# Patient Record
Sex: Male | Born: 1945 | Race: White | Hispanic: No | Smoking: Current every day smoker
Health system: Southern US, Community
[De-identification: ages and names within clinical notes are randomized; demographics above are authoritative.]

## PROBLEM LIST (undated history)

## (undated) DIAGNOSIS — F419 Anxiety disorder, unspecified: Secondary | ICD-10-CM

## (undated) DIAGNOSIS — I2510 Atherosclerotic heart disease of native coronary artery without angina pectoris: Secondary | ICD-10-CM

## (undated) DIAGNOSIS — J45909 Unspecified asthma, uncomplicated: Secondary | ICD-10-CM

## (undated) DIAGNOSIS — N289 Disorder of kidney and ureter, unspecified: Secondary | ICD-10-CM

## (undated) DIAGNOSIS — C3490 Malignant neoplasm of unspecified part of unspecified bronchus or lung: Secondary | ICD-10-CM

## (undated) DIAGNOSIS — I509 Heart failure, unspecified: Secondary | ICD-10-CM

## (undated) DIAGNOSIS — I10 Essential (primary) hypertension: Secondary | ICD-10-CM

## (undated) DIAGNOSIS — R918 Other nonspecific abnormal finding of lung field: Secondary | ICD-10-CM

## (undated) DIAGNOSIS — F329 Major depressive disorder, single episode, unspecified: Secondary | ICD-10-CM

## (undated) DIAGNOSIS — E119 Type 2 diabetes mellitus without complications: Secondary | ICD-10-CM

## (undated) DIAGNOSIS — F32A Depression, unspecified: Secondary | ICD-10-CM

## (undated) DIAGNOSIS — Z0000 Encounter for general adult medical examination without abnormal findings: Secondary | ICD-10-CM

## (undated) DIAGNOSIS — M359 Systemic involvement of connective tissue, unspecified: Secondary | ICD-10-CM

## (undated) DIAGNOSIS — J60 Coalworker's pneumoconiosis: Secondary | ICD-10-CM

## (undated) DIAGNOSIS — J449 Chronic obstructive pulmonary disease, unspecified: Secondary | ICD-10-CM

## (undated) HISTORY — PX: KIDNEY STONE SURGERY: SHX686

## (undated) HISTORY — PX: BACK SURGERY: SHX140

## (undated) HISTORY — PX: CHOLECYSTECTOMY: SHX55

## (undated) HISTORY — PX: ANKLE RECONSTRUCTION: SHX1151

## (undated) HISTORY — DX: Other nonspecific abnormal finding of lung field: R91.8

## (undated) HISTORY — DX: Malignant neoplasm of unspecified part of unspecified bronchus or lung: C34.90

## (undated) HISTORY — PX: IVC FILTER PLACEMENT (ARMC HX): HXRAD1551

---

## 2011-02-11 DIAGNOSIS — F1120 Opioid dependence, uncomplicated: Secondary | ICD-10-CM | POA: Insufficient documentation

## 2011-02-20 DIAGNOSIS — F1411 Cocaine abuse, in remission: Secondary | ICD-10-CM | POA: Insufficient documentation

## 2011-02-20 DIAGNOSIS — E785 Hyperlipidemia, unspecified: Secondary | ICD-10-CM | POA: Insufficient documentation

## 2012-06-09 DIAGNOSIS — M961 Postlaminectomy syndrome, not elsewhere classified: Secondary | ICD-10-CM | POA: Insufficient documentation

## 2014-07-24 DIAGNOSIS — R079 Chest pain, unspecified: Secondary | ICD-10-CM | POA: Insufficient documentation

## 2015-07-09 DIAGNOSIS — F329 Major depressive disorder, single episode, unspecified: Secondary | ICD-10-CM | POA: Insufficient documentation

## 2015-07-09 DIAGNOSIS — F419 Anxiety disorder, unspecified: Secondary | ICD-10-CM | POA: Insufficient documentation

## 2015-07-09 DIAGNOSIS — F32A Depression, unspecified: Secondary | ICD-10-CM | POA: Insufficient documentation

## 2015-09-03 ENCOUNTER — Emergency Department
Admission: EM | Admit: 2015-09-03 | Discharge: 2015-09-03 | Disposition: A | Discharge status: Home / Self Care | Payer: Medicare Other | Attending: Emergency Medicine | Admitting: Emergency Medicine

## 2015-09-03 ENCOUNTER — Encounter: Payer: Self-pay | Admitting: Emergency Medicine

## 2015-09-03 ENCOUNTER — Emergency Department: Payer: Medicare Other

## 2015-09-03 DIAGNOSIS — I509 Heart failure, unspecified: Secondary | ICD-10-CM | POA: Insufficient documentation

## 2015-09-03 DIAGNOSIS — R079 Chest pain, unspecified: Secondary | ICD-10-CM | POA: Insufficient documentation

## 2015-09-03 DIAGNOSIS — I10 Essential (primary) hypertension: Secondary | ICD-10-CM | POA: Diagnosis not present

## 2015-09-03 DIAGNOSIS — R1011 Right upper quadrant pain: Secondary | ICD-10-CM | POA: Diagnosis not present

## 2015-09-03 DIAGNOSIS — E119 Type 2 diabetes mellitus without complications: Secondary | ICD-10-CM | POA: Diagnosis not present

## 2015-09-03 HISTORY — DX: Type 2 diabetes mellitus without complications: E11.9

## 2015-09-03 HISTORY — DX: Systemic involvement of connective tissue, unspecified: M35.9

## 2015-09-03 HISTORY — DX: Depression, unspecified: F32.A

## 2015-09-03 HISTORY — DX: Essential (primary) hypertension: I10

## 2015-09-03 HISTORY — DX: Major depressive disorder, single episode, unspecified: F32.9

## 2015-09-03 HISTORY — DX: Unspecified asthma, uncomplicated: J45.909

## 2015-09-03 HISTORY — DX: Heart failure, unspecified: I50.9

## 2015-09-03 HISTORY — DX: Encounter for general adult medical examination without abnormal findings: Z00.00

## 2015-09-03 HISTORY — DX: Chronic obstructive pulmonary disease, unspecified: J44.9

## 2015-09-03 HISTORY — DX: Atherosclerotic heart disease of native coronary artery without angina pectoris: I25.10

## 2015-09-03 LAB — COMPREHENSIVE METABOLIC PANEL
ALBUMIN: 4.3 g/dL (ref 3.5–5.0)
ALT: 28 U/L (ref 17–63)
AST: 38 U/L (ref 15–41)
Alkaline Phosphatase: 113 U/L (ref 38–126)
Anion gap: 11 (ref 5–15)
BUN: 12 mg/dL (ref 6–20)
CHLORIDE: 98 mmol/L — AB (ref 101–111)
CO2: 22 mmol/L (ref 22–32)
CREATININE: 1.08 mg/dL (ref 0.61–1.24)
Calcium: 9 mg/dL (ref 8.9–10.3)
GFR calc Af Amer: >60 mL/min (ref >60)
GFR calc non Af Amer: >60 mL/min (ref >60)
Glucose, Bld: 269 mg/dL — ABNORMAL HIGH (ref 65–99)
POTASSIUM: 4.2 mmol/L (ref 3.5–5.1)
SODIUM: 131 mmol/L — AB (ref 135–145)
Total Bilirubin: 0.9 mg/dL (ref 0.3–1.2)
Total Protein: 8 g/dL (ref 6.5–8.1)

## 2015-09-03 LAB — CBC
HCT: 44.2 % (ref 40.0–52.0)
Hemoglobin: 14.3 g/dL (ref 13.0–18.0)
MCH: 25.8 pg — ABNORMAL LOW (ref 26.0–34.0)
MCHC: 32.3 g/dL (ref 32.0–36.0)
MCV: 80 fL (ref 80.0–100.0)
PLATELETS: 182 10*3/uL (ref 150–440)
RBC: 5.53 MIL/uL (ref 4.40–5.90)
RDW: 14 % (ref 11.5–14.5)
WBC: 13.9 10*3/uL — AB (ref 3.8–10.6)

## 2015-09-03 LAB — FIBRIN DERIVATIVES D-DIMER (ARMC ONLY): Fibrin derivatives D-dimer (ARMC): 650.88 — ABNORMAL HIGH (ref 0–499)

## 2015-09-03 LAB — PROTIME-INR
INR: 1.76
Prothrombin Time: 20.7 seconds — ABNORMAL HIGH (ref 11.4–15.0)

## 2015-09-03 LAB — LIPASE, BLOOD: LIPASE: 17 U/L — AB (ref 22–51)

## 2015-09-03 LAB — TROPONIN I

## 2015-09-03 LAB — APTT: APTT: 24 s (ref 24–36)

## 2015-09-03 MED ORDER — HYDROMORPHONE HCL 1 MG/ML IJ SOLN
1.0000 mg | Freq: Once | INTRAMUSCULAR | Status: AC
  Administered 2015-09-03: 1 mg via INTRAVENOUS
  Filled 2015-09-03: qty 1

## 2015-09-03 MED ORDER — OXYCODONE-ACETAMINOPHEN 5-325 MG PO TABS: 1.0000 | ORAL_TABLET | Freq: Four times a day (QID) | ORAL | Status: DC | PRN

## 2015-09-03 MED ORDER — ONDANSETRON 8 MG PO TBDP: 8.0000 mg | ORAL_TABLET | Freq: Three times a day (TID) | ORAL | Status: DC | PRN

## 2015-09-03 MED ORDER — METOCLOPRAMIDE HCL 10 MG PO TABS: 10.0000 mg | ORAL_TABLET | Freq: Three times a day (TID) | ORAL | Status: DC

## 2015-09-03 MED ORDER — DIPHENHYDRAMINE HCL 25 MG PO CAPS: 50.0000 mg | ORAL_CAPSULE | Freq: Four times a day (QID) | ORAL | Status: DC | PRN

## 2015-09-03 MED ORDER — IOHEXOL 240 MG/ML SOLN
25.0000 mL | Freq: Once | INTRAMUSCULAR | Status: AC | PRN
  Administered 2015-09-03: 25 mL via ORAL

## 2015-09-03 MED ORDER — ONDANSETRON HCL 4 MG/2ML IJ SOLN
4.0000 mg | Freq: Once | INTRAMUSCULAR | Status: AC
  Administered 2015-09-03: 4 mg via INTRAVENOUS
  Filled 2015-09-03: qty 2

## 2015-09-03 MED ORDER — FAMOTIDINE 20 MG PO TABS
40.0000 mg | ORAL_TABLET | Freq: Once | ORAL | Status: AC
  Administered 2015-09-03: 40 mg via ORAL
  Filled 2015-09-03: qty 2

## 2015-09-03 MED ORDER — GI COCKTAIL ~~LOC~~
30.0000 mL | ORAL | Status: AC
Start: 2015-09-03 — End: 2015-09-03
  Administered 2015-09-03: 30 mL via ORAL
  Filled 2015-09-03 (×2): qty 30

## 2015-09-03 MED ORDER — IOHEXOL 350 MG/ML SOLN
100.0000 mL | Freq: Once | INTRAVENOUS | Status: AC | PRN
  Administered 2015-09-03: 100 mL via INTRAVENOUS

## 2015-09-03 MED ORDER — HYDROMORPHONE HCL 1 MG/ML IJ SOLN
INTRAMUSCULAR | Status: AC
  Filled 2015-09-03: qty 1

## 2015-09-03 MED ORDER — SODIUM CHLORIDE 0.9 % IV BOLUS (SEPSIS)
1000.0000 mL | Freq: Once | INTRAVENOUS | Status: AC
  Administered 2015-09-03: 1000 mL via INTRAVENOUS

## 2015-09-03 NOTE — ED Notes (Signed)
Pt here with right sided flank pain that started last night and increaed today. Pt states that he is having nausea, but no vomiting.  Pt is holding right side due to pain. Pt in wheelchair and appears to be in severe pain.

## 2015-09-03 NOTE — ED Notes (Addendum)
meds given.  Resumed care from tricia rn.  Pt alert.  Iv in place.

## 2015-09-03 NOTE — ED Provider Notes (Signed)
[facility] Emergency Department Provider Note  ____________________________________________  Time seen: 11:35 AM  I have reviewed the triage vital signs and the nursing notes.   HISTORY  Chief Complaint Abdominal Pain    HPI Fernando Marshall is a 69 y.o. male who complains of right-sided flank pain that started last night, sudden onset while at rest, constant and worsening. It is severe in intensity in the right upper quadrant radiating through to the back. He reports nausea but no vomiting. Unable to eat or drink anything today. No trauma or falls. Reports it is worse with deep breathing and is associated with a feeling of shortness of breath and chest pain in the right inferior chest as well. No fevers or chills. No diarrhea no syncope.  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 He also has an IVC filter.     Past Medical History  Diagnosis Date  . Hypertension   . CHF (congestive heart failure)   . Asthma   . COPD (chronic obstructive pulmonary disease)   . Coronary artery disease   . Diabetes mellitus without complication   . Depression   . PE (physical exam), annual   . Collagen vascular disease      There are no active problems to display for this patient.    Past Surgical History  Procedure Laterality Date  . Ivc filter placement ([facility] hx)       No current outpatient prescriptions on file. Coumadin  Allergies Metformin and related and Morphine and related   No family history on file.  Social History Social History  Substance Use Topics  . Smoking status: Never Smoker   . Smokeless tobacco: Not on file  . Alcohol Use: No    Review of Systems  Constitutional:   No fever or chills. No weight changes Eyes:   No blurry vision or double vision.  ENT:   No sore throat. Cardiovascular:   No chest pain. Respiratory:   No dyspnea or cough. Gastrointestinal:   Positive right upper quadrant abdominal  pain as above. No vomiting or diarrhea..  No BRBPR or melena. Genitourinary:   Negative for dysuria, urinary retention, bloody urine, or difficulty urinating. Musculoskeletal:   Negative for back pain. No joint swelling or pain. Skin:   Negative for rash. Neurological:   Negative for headaches, focal weakness or numbness. Psychiatric:  No anxiety or depression.   Endocrine:  No hot/cold intolerance, changes in energy, or sleep difficulty.  10-point ROS otherwise negative.  ____________________________________________   PHYSICAL EXAM:  VITAL SIGNS: ED Triage Vitals  Enc Vitals Group     BP 09/03/15 1056 170/74 mmHg     Pulse Rate 09/03/15 1056 79     Resp 09/03/15 1056 18     Temp 09/03/15 1056 98 F (36.7 C)     Temp Source 09/03/15 1056 Oral     SpO2 09/03/15 1056 97 %     Weight 09/03/15 1056 203 lb (92.08 kg)     Height 09/03/15 1056 '5\' 6"'$  (1.676 m)     Head Cir --      Peak Flow --      Pain Score 09/03/15 1134 10     Pain Loc --      Pain Edu? --      Excl. in [place]? --      Constitutional:   Alert and oriented. Moderate distress due to pain. Eyes:   No scleral icterus.  No conjunctival pallor. PERRL. EOMI ENT   Head:   Normocephalic and atraumatic.   Nose:   No congestion/rhinnorhea. No septal hematoma   Mouth/Throat:   MMM, no pharyngeal erythema. No peritonsillar mass. No uvula shift.   Neck:   No stridor. No SubQ emphysema. No meningismus. Hematological/Lymphatic/Immunilogical:   No cervical lymphadenopathy. Cardiovascular:   RRR. Normal and symmetric distal pulses are present in all extremities. No murmurs, rubs, or gallops. Respiratory:   Normal respiratory effort without tachypnea nor retractions. Breath sounds are clear and equal bilaterally. No wheezes/rales/rhonchi. Gastrointestinal:   Right upper quadrant and epigastric tenderness.. No distention. There is right CVA tenderness.  No rebound, rigidity, or guarding. Genitourinary:    deferred Musculoskeletal:   Nontender with normal range of motion in all extremities. No joint effusions.  No lower extremity tenderness.  No edema. Neurologic:   Normal speech and language.  CN 2-10 normal. Motor grossly intact. No pronator drift.  Normal gait. No gross focal neurologic deficits are appreciated.  Skin:    Skin is warm, dry and intact. No rash noted.  No petechiae, purpura, or bullae. Psychiatric:   Mood and affect are normal. Speech and behavior are normal. Patient exhibits appropriate insight and judgment.  ____________________________________________    LABS (pertinent positives/negatives) (all labs ordered are listed, but only abnormal results are displayed) Labs Reviewed  CBC - Abnormal; Notable for the following:    WBC 13.9 (*)    MCH 25.8 (*)    All other components within normal limits  COMPREHENSIVE METABOLIC PANEL - Abnormal; Notable for the following:    Sodium 131 (*)    Chloride 98 (*)    Glucose, Bld 269 (*)    All other components within normal limits  PROTIME-INR - Abnormal; Notable for the following:    Prothrombin Time 20.7 (*)    All other components within normal limits  FIBRIN DERIVATIVES D-DIMER ([facility] ONLY) - Abnormal; Notable for the following:    Fibrin derivatives D-dimer ([facility]) 650.88 (*)    All other components within normal limits  LIPASE, BLOOD - Abnormal; Notable for the following:    Lipase 17 (*)    All other components within normal limits  APTT  TROPONIN I   ____________________________________________   EKG  Interpreted by me Normal sinus rhythm rate of 81, left axis, normal intervals, voltage criteria for left ventricular hypertrophy, normal ST segments and T waves.  ____________________________________________    RADIOLOGY  Ultrasound right upper quadrant finds that the gallbladder is surgically absent. Chest x-ray reveals a nonspecific right infrahilar density and airway thickening suggestive of bronchitis or  reactive airway disease.  CT angiography of the chest and CT abdomen and pelvis with IV contrast unremarkable.  ____________________________________________   PROCEDURES   ____________________________________________   INITIAL IMPRESSION / ASSESSMENT AND PLAN / ED COURSE  Pertinent labs & imaging results that were available during my care of the patient were reviewed by me and considered in my medical decision making (see chart for details).  Patient presents with severe pain concerning for cholecystitis. We'll get ultrasound of the right upper quadrant as well as chest x-ray and labs done. IV saline and Zofran and Dilaudid for symptom control.  ----------------------------------------- 2:40 PM on 09/03/2015 -----------------------------------------  Labs are unremarkable. Ultrasound found that the patient actually has had a prior cholecystectomy which she did not recall when asked about his surgical history. Given this and his other history of PE and a subtherapeutic INR, a CT angiogram of the chest  was obtained along with CT imaging of the abdomen and pelvis. These have resulted in her overall unremarkable. No isolation for his pain, but it does not appear to be anything significant at this time. We'll plan to discharge him home. He takes Dilaudid at home. The patient's d-dimer is mildly elevated, which is expected given his chronic IVC filter.     ____________________________________________   FINAL CLINICAL IMPRESSION(S) / ED DIAGNOSES  Final diagnoses:  Chest pain   right upper quadrant abdominal pain.    Carrie Mew, MD 09/03/15 [phone]

## 2015-09-03 NOTE — Discharge Instructions (Signed)
Your chest x-ray is unremarkable. Your ultrasound shows that you had previously had your gallbladder removed. CT scans of your chest and abdomen were unremarkable. Your blood tests were all unremarkable. At this point there is no explanation for your pain but it does not appear to be anything serious. Please follow-up with your doctor this week for continued monitoring of your symptoms.  Abdominal Pain Many things can cause abdominal pain. Usually, abdominal pain is not caused by a disease and will improve without treatment. It can often be observed and treated at home. Your health care provider will do a physical exam and possibly order blood tests and X-rays to help determine the seriousness of your pain. However, in many cases, more time must pass before a clear cause of the pain can be found. Before that point, your health care provider may not know if you need more testing or further treatment. HOME CARE INSTRUCTIONS  Monitor your abdominal pain for any changes. The following actions may help to alleviate any discomfort you are experiencing:  Only take over-the-counter or prescription medicines as directed by your health care provider.  Do not take laxatives unless directed to do so by your health care provider.  Try a clear liquid diet (broth, tea, or water) as directed by your health care provider. Slowly move to a bland diet as tolerated. SEEK MEDICAL CARE IF:  You have unexplained abdominal pain.  You have abdominal pain associated with nausea or diarrhea.  You have pain when you urinate or have a bowel movement.  You experience abdominal pain that wakes you in the night.  You have abdominal pain that is worsened or improved by eating food.  You have abdominal pain that is worsened with eating fatty foods.  You have a fever. SEEK IMMEDIATE MEDICAL CARE IF:   Your pain does not go away within 2 hours.  You keep throwing up (vomiting).  Your pain is felt only in portions of  the abdomen, such as the right side or the left lower portion of the abdomen.  You pass bloody or black tarry stools. MAKE SURE YOU:  Understand these instructions.   Will watch your condition.   Will get help right away if you are not doing well or get worse.  Document Released: 09/17/2005 Document Revised: 12/13/2013 Document Reviewed: 08/17/2013 [facility] Patient Information 2015 [place], [place]. This information is not intended to replace advice given to you by your health care provider. Make sure you discuss any questions you have with your health care provider.

## 2015-09-04 ENCOUNTER — Encounter: Payer: Self-pay | Admitting: *Deleted

## 2015-09-04 ENCOUNTER — Other Ambulatory Visit: Payer: Self-pay

## 2015-09-04 ENCOUNTER — Emergency Department
Admission: EM | Admit: 2015-09-04 | Discharge: 2015-09-04 | Disposition: A | Discharge status: Home / Self Care | Payer: Medicare Other | Attending: Emergency Medicine | Admitting: Emergency Medicine

## 2015-09-04 DIAGNOSIS — E119 Type 2 diabetes mellitus without complications: Secondary | ICD-10-CM | POA: Diagnosis not present

## 2015-09-04 DIAGNOSIS — M546 Pain in thoracic spine: Secondary | ICD-10-CM | POA: Insufficient documentation

## 2015-09-04 DIAGNOSIS — M549 Dorsalgia, unspecified: Secondary | ICD-10-CM

## 2015-09-04 DIAGNOSIS — I10 Essential (primary) hypertension: Secondary | ICD-10-CM | POA: Insufficient documentation

## 2015-09-04 DIAGNOSIS — R079 Chest pain, unspecified: Secondary | ICD-10-CM | POA: Insufficient documentation

## 2015-09-04 DIAGNOSIS — Z79899 Other long term (current) drug therapy: Secondary | ICD-10-CM | POA: Diagnosis not present

## 2015-09-04 DIAGNOSIS — M792 Neuralgia and neuritis, unspecified: Secondary | ICD-10-CM

## 2015-09-04 LAB — CBC
HEMATOCRIT: 43.2 % (ref 40.0–52.0)
Hemoglobin: 14.1 g/dL (ref 13.0–18.0)
MCH: 26.2 pg (ref 26.0–34.0)
MCHC: 32.6 g/dL (ref 32.0–36.0)
MCV: 80.4 fL (ref 80.0–100.0)
PLATELETS: 155 10*3/uL (ref 150–440)
RBC: 5.37 MIL/uL (ref 4.40–5.90)
RDW: 14.2 % (ref 11.5–14.5)
WBC: 10.5 10*3/uL (ref 3.8–10.6)

## 2015-09-04 LAB — COMPREHENSIVE METABOLIC PANEL
ALT: 68 U/L — ABNORMAL HIGH (ref 17–63)
ANION GAP: 11 (ref 5–15)
AST: 47 U/L — AB (ref 15–41)
Albumin: 4.1 g/dL (ref 3.5–5.0)
Alkaline Phosphatase: 170 U/L — ABNORMAL HIGH (ref 38–126)
BILIRUBIN TOTAL: 0.5 mg/dL (ref 0.3–1.2)
BUN: 16 mg/dL (ref 6–20)
CHLORIDE: 104 mmol/L (ref 101–111)
CO2: 22 mmol/L (ref 22–32)
Calcium: 9 mg/dL (ref 8.9–10.3)
Creatinine, Ser: 1.08 mg/dL (ref 0.61–1.24)
Glucose, Bld: 185 mg/dL — ABNORMAL HIGH (ref 65–99)
POTASSIUM: 3.8 mmol/L (ref 3.5–5.1)
Sodium: 137 mmol/L (ref 135–145)
TOTAL PROTEIN: 7.7 g/dL (ref 6.5–8.1)

## 2015-09-04 LAB — TROPONIN I: Troponin I: 0.03 ng/mL (ref <0.031)

## 2015-09-04 MED ORDER — ACYCLOVIR 200 MG PO CAPS
400.0000 mg | ORAL_CAPSULE | Freq: Once | ORAL | Status: AC
  Administered 2015-09-04: 400 mg via ORAL
  Filled 2015-09-04: qty 2

## 2015-09-04 MED ORDER — DIAZEPAM 2 MG PO TABS
2.0000 mg | ORAL_TABLET | Freq: Once | ORAL | Status: AC
  Administered 2015-09-04: 2 mg via ORAL

## 2015-09-04 MED ORDER — OXYCODONE HCL 5 MG PO TABS
ORAL_TABLET | ORAL | Status: AC
  Administered 2015-09-04: 5 mg via ORAL
  Filled 2015-09-04: qty 1

## 2015-09-04 MED ORDER — KETOROLAC TROMETHAMINE 60 MG/2ML IM SOLN
60.0000 mg | Freq: Once | INTRAMUSCULAR | Status: AC
  Administered 2015-09-04: 60 mg via INTRAMUSCULAR

## 2015-09-04 MED ORDER — DIAZEPAM 2 MG PO TABS
ORAL_TABLET | ORAL | Status: AC
  Administered 2015-09-04: 2 mg via ORAL
  Filled 2015-09-04: qty 1

## 2015-09-04 MED ORDER — DIAZEPAM 2 MG PO TABS: 2.0000 mg | ORAL_TABLET | Freq: Three times a day (TID) | ORAL | Status: DC | PRN

## 2015-09-04 MED ORDER — OXYCODONE HCL 5 MG PO TABS
5.0000 mg | ORAL_TABLET | Freq: Once | ORAL | Status: AC
  Administered 2015-09-04: 5 mg via ORAL

## 2015-09-04 MED ORDER — ACYCLOVIR 400 MG PO TABS: 400.0000 mg | ORAL_TABLET | Freq: Every day | ORAL | Status: AC

## 2015-09-04 MED ORDER — OXYCODONE HCL 5 MG PO TABS
ORAL_TABLET | ORAL | Status: AC
  Administered 2015-09-04: 10 mg via ORAL
  Filled 2015-09-04: qty 2

## 2015-09-04 MED ORDER — KETOROLAC TROMETHAMINE 60 MG/2ML IM SOLN
INTRAMUSCULAR | Status: AC
  Administered 2015-09-04: 60 mg via INTRAMUSCULAR
  Filled 2015-09-04: qty 2

## 2015-09-04 MED ORDER — OXYCODONE HCL 5 MG PO TABS
10.0000 mg | ORAL_TABLET | Freq: Once | ORAL | Status: AC
  Administered 2015-09-04: 10 mg via ORAL

## 2015-09-04 NOTE — ED Provider Notes (Signed)
[facility] Emergency Department Provider Note  ____________________________________________  Time seen: 1827  I have reviewed the triage vital signs and the nursing notes.   HISTORY  Chief Complaint Severe pain, right middle back, thoracic    HPI Fernando Marshall is a 69 y.o. male who developed severe pain in his right thoracic area yesterday. He was seen in the emergency department and had a thorough evaluation, including chest x-ray and CT scan to rule out pulmonary emboli. He does have a history of prior blood clots area he has an IVC filter in place.  His CT scan yesterday was negative for pulmonary emboli. His chest x-ray was negative as well as. I have reviewed the history and physical from that encounter.  The patient was treated with Dilaudid. He reports this got the pain under control. The medicine wore off last night and he has had pain since then. He has not been able to get the medicine that was prescribed filled. He lives in a group home, and they do that slower fashion.  The pain is worse with any movement. He has difficulty sitting up or standing up.  He denies any rash. He has no history of shingles, although the location makes me, the physician, consider that as a possible diagnosis.    Past Medical History  Diagnosis Date  . Hypertension   . CHF (congestive heart failure)   . Asthma   . COPD (chronic obstructive pulmonary disease)   . Coronary artery disease   . Diabetes mellitus without complication   . Depression   . PE (physical exam), annual   . Collagen vascular disease     There are no active problems to display for this patient.   Past Surgical History  Procedure Laterality Date  . Ivc filter placement ([facility] hx)      Current Outpatient Rx  Name  Route  Sig  Dispense  Refill  . acyclovir (ZOVIRAX) 400 MG tablet   Oral   Take 1 tablet (400 mg total) by mouth 5 (five) times daily.   50 tablet   0   . diazepam (VALIUM) 2  MG tablet   Oral   Take 1 tablet (2 mg total) by mouth every 8 (eight) hours as needed for anxiety or muscle spasms.   20 tablet   0   . diphenhydrAMINE (BENADRYL) 25 mg capsule   Oral   Take 2 capsules (50 mg total) by mouth every 6 (six) hours as needed.   60 capsule   0   . metoCLOPramide (REGLAN) 10 MG tablet   Oral   Take 1 tablet (10 mg total) by mouth 4 (four) times daily -  before meals and at bedtime.   60 tablet   0   . ondansetron (ZOFRAN ODT) 8 MG disintegrating tablet   Oral   Take 1 tablet (8 mg total) by mouth every 8 (eight) hours as needed for nausea or vomiting.   20 tablet   0   . oxyCODONE-acetaminophen (ROXICET) 5-325 MG per tablet   Oral   Take 1 tablet by mouth every 6 (six) hours as needed for severe pain.   12 tablet   0     Allergies Metformin and related and Morphine and related  No family history on file.  Social History Social History  Substance Use Topics  . Smoking status: Never Smoker   . Smokeless tobacco: None  . Alcohol Use: No    Review of Systems  Constitutional: Negative for fever. ENT: Negative for sore throat. Cardiovascular: Chest pain in the right posterior. See history of present illness. Respiratory: Hurts to breath. See history of present illness Gastrointestinal: Negative for abdominal pain, vomiting and diarrhea. Genitourinary: Negative for dysuria. Musculoskeletal: No myalgias or injuries. Skin: Negative for rash. Neurological: Negative for headaches   10-point ROS otherwise negative.  ____________________________________________   PHYSICAL EXAM:  VITAL SIGNS: ED Triage Vitals  Enc Vitals Group     BP 09/04/15 1523 177/79 mmHg     Pulse Rate 09/04/15 1523 88     Resp 09/04/15 1523 18     Temp 09/04/15 1523 98 F (36.7 C)     Temp Source 09/04/15 1523 Oral     SpO2 09/04/15 1523 94 %     Weight 09/04/15 1523 203 lb (92.08 kg)     Height 09/04/15 1523 '5\' 6"'$  (1.676 m)     Head Cir --      Peak  Flow --      Pain Score 09/04/15 1534 10     Pain Loc --      Pain Edu? --      Excl. in [place]? --     Constitutional: Alert and oriented. Appears a little uncomfortable, but worse when he attempts to sit up or moves. ENT   Head: Normocephalic and atraumatic.   Nose: No congestion/rhinnorhea.   Mouth/Throat: Mucous membranes are moist. Cardiovascular: Normal rate, regular rhythm, no murmur noted Thoracic: Patient was notable tenderness beginning in the right side of his back at around T8 or 9. Moving laterally, there is an exquisitely tender focal area, very tender on light palpation. Respiratory:  Normal respiratory effort, no tachypnea.    Breath sounds are clear and equal bilaterally.  Gastrointestinal: Soft and nontender. No distention. . Musculoskeletal: No deformity noted. Nontender with normal range of motion in all extremities.  No noted edema. Neurologic:  Normal speech and language. No gross focal neurologic deficits are appreciated.  Skin:  Skin is warm, dry. No rash noted. No break in skin or indication of a zoster in the tender area in his right posterior thoracic area. Psychiatric: Mood and affect are normal. Speech and behavior are normal.  ____________________________________________    LABS (pertinent positives/negatives)  Labs Reviewed  COMPREHENSIVE METABOLIC PANEL - Abnormal; Notable for the following:    Glucose, Bld 185 (*)    AST 47 (*)    ALT 68 (*)    Alkaline Phosphatase 170 (*)    All other components within normal limits  CBC  TROPONIN I     ____________________________________________   EKG  ED ECG REPORT I, Meta Kroenke W, the attending physician, personally viewed and interpreted this ECG.   Date: 09/04/2015  EKG Time: 1602  Rate: 78  Rhythm: Normal sinus rhythm  Axis: Left axis deviation  Intervals: Normal  ST&T Change: None  noted   ____________________________________________ ____________________________________________   INITIAL IMPRESSION / ASSESSMENT AND PLAN / ED COURSE  Pertinent labs & imaging results that were available during my care of the patient were reviewed by me and considered in my medical decision making (see chart for details).  I reviewed the evaluation, H&P, and test results from yesterday. He had a negative CT ruling out PE. This also rules out a number of other items that could be on the differential diagnosis. #1 on the differential is a superficial tenderness. He is exquisitely tender to very light palpation. This has made pondering shingles without skin reaction yet.  I am also considering a musculoskeletal inflammatory process. We will treat him with Toradol IM, oxycodone, and a little Valium to help with muscle tension in case this is primarily musculoskeletal.  ----------------------------------------- 8:47 PM on 09/04/2015 -----------------------------------------  Patient is little bit more comfortable after the medications. He sits up a little bit more easily. Repeat exam of his lungs finds them still clear without wheezing.  I discussed the differential diagnosis of the patient. This includes the possibility of neurogenic pain, including shingles. While there is no skin outbreak yet, the severity of his pain raises the question of shingles and we will place him on acyclovir. He feels the Valium did help him and we will prescribe a low-dose, 2 mg tablet to be used when necessary.  ____________________________________________   FINAL CLINICAL IMPRESSION(S) / ED DIAGNOSES  Final diagnoses:  Upper back pain on right side  Neurogenic pain      Ahmed Prima, MD 09/04/15 2113

## 2015-09-04 NOTE — ED Notes (Signed)
Pt reports "lung pain" over the mid right back area since Sunday. Pt was here for same symptoms last night, states "pain is worse." Has not filled meds prescribed yesterday.

## 2015-09-04 NOTE — ED Notes (Signed)
Called and spoke with Fernando Marshall from pt's group home [place] [phone]); informed caregiver pt ready for d/c; caregiver yelling, stating "what are ya'll thinking sending him home this time of night, I got other residents here!"; caregiver then hangs up phone; called caregiver back and again informed her that her resident is ready to be picked up and again caregiver yelling, stating "ya'll better put him back in a taxi or something"; informed caregiver that it is her responsibility to provide transporation for her resident; caregiver again hangs up phone; attempted to call back and call goes directly to voicemail; notified charge nurse who will send [facility] PD officer to home

## 2015-09-04 NOTE — ED Notes (Signed)
Patient states Sunday night he was watching a ball game and developed pain to right lung area to right back. Pain worsens with movement.

## 2015-09-04 NOTE — ED Notes (Signed)
Spero Geralds, caregiver here to transport pt home

## 2015-09-04 NOTE — Discharge Instructions (Signed)
You have a very focal and exquisite tenderness in your right upper back. This may be musculoskeletal or it may be neurogenic. This would include the possible diagnosis of shingles, but you have no rash or skin break. Take medicines prescribed by the doctor he saw yesterday. He may also add acyclovir in case this is shingles. Take Valium to reduce muscle spasm in case this is more musculoskeletal. Follow-up with another doctor for reevaluation and ongoing care. Return to the emergency department if you have uncontrolled pain, increasing shortness of breath, or other urgent concerns.  Neuropathic Pain We often think that pain has a physical cause. If we get rid of the cause, the pain should go away. Nerves themselves can also cause pain. It is called neuropathic pain, which means nerve abnormality. It may be difficult for the patients who have it and for the treating caregivers. Pain is usually described as acute (short-lived) or chronic (long-lasting). Acute pain is related to the physical sensations caused by an injury. It can last from a few seconds to many weeks, but it usually goes away when normal healing occurs. Chronic pain lasts beyond the typical healing time. With neuropathic pain, the nerve fibers themselves may be damaged or injured. They then send incorrect signals to other pain centers. The pain you feel is real, but the cause is not easy to find.  CAUSES  Chronic pain can result from diseases, such as diabetes and shingles (an infection related to chickenpox), or from trauma, surgery, or amputation. It can also happen without any known injury or disease. The nerves are sending pain messages, even though there is no identifiable cause for such messages.   Other common causes of neuropathy include diabetes, phantom limb pain, or Regional Pain Syndrome (RPS).  As with all forms of chronic back pain, if neuropathy is not correctly treated, there can be a number of associated problems that lead  to a downward cycle for the patient. These include depression, sleeplessness, feelings of fear and anxiety, limited social interaction and inability to do normal daily activities or work.  The most dramatic and mysterious example of neuropathic pain is called "phantom limb syndrome." This occurs when an arm or a leg has been removed because of illness or injury. The brain still gets pain messages from the nerves that originally carried impulses from the missing limb. These nerves now seem to misfire and cause troubling pain.  Neuropathic pain often seems to have no cause. It responds poorly to standard pain treatment. Neuropathic pain can occur after:  Shingles (herpes zoster virus infection).  A lasting burning sensation of the skin, caused usually by injury to a peripheral nerve.  Peripheral neuropathy which is widespread nerve damage, often caused by diabetes or alcoholism.  Phantom limb pain following an amputation.  Facial nerve problems (trigeminal neuralgia).  Multiple sclerosis.  Reflex sympathetic dystrophy.  Pain which comes with cancer and cancer chemotherapy.  Entrapment neuropathy such as when pressure is put on a nerve such as in carpal tunnel syndrome.  Back, leg, and hip problems (sciatica).  Spine or back surgery.  HIV Infection or AIDS where nerves are infected by viruses. Your caregiver can explain items in the above list which may apply to you. SYMPTOMS  Characteristics of neuropathic pain are:  Severe, sharp, electric shock-like, shooting, lightening-like, knife-like.  Pins and needles sensation.  Deep burning, deep cold, or deep ache.  Persistent numbness, tingling, or weakness.  Pain resulting from light touch or other stimulus that would  not usually cause pain.  Increased sensitivity to something that would normally cause pain, such as a pinprick. Pain may persist for months or years following the healing of damaged tissues. When this happens, pain  signals no longer sound an alarm about current injuries or injuries about to happen. Instead, the alarm system itself is not working correctly.  Neuropathic pain may get worse instead of better over time. For some people, it can lead to serious disability. It is important to be aware that severe injury in a limb can occur without a proper, protective pain response.Burns, cuts, and other injuries may go unnoticed. Without proper treatment, these injuries can become infected or lead to further disability. Take any injury seriously, and consult your caregiver for treatment. DIAGNOSIS  When you have a pain with no known cause, your caregiver will probably ask some specific questions:   Do you have any other conditions, such as diabetes, shingles, multiple sclerosis, or HIV infection?  How would you describe your pain? (Neuropathic pain is often described as shooting, stabbing, burning, or searing.)  Is your pain worse at any time of the day? (Neuropathic pain is usually worse at night.)  Does the pain seem to follow a certain physical pathway?  Does the pain come from an area that has missing or injured nerves? (An example would be phantom limb pain.)  Is the pain triggered by minor things such as rubbing against the sheets at night? These questions often help define the type of pain involved. Once your caregiver knows what is happening, treatment can begin. Anticonvulsant, antidepressant drugs, and various pain relievers seem to work in some cases. If another condition, such as diabetes is involved, better management of that disorder may relieve the neuropathic pain.  TREATMENT  Neuropathic pain is frequently long-lasting and tends not to respond to treatment with narcotic type pain medication. It may respond well to other drugs such as antiseizure and antidepressant medications. Usually, neuropathic problems do not completely go away, but partial improvement is often possible with proper treatment.  Your caregivers have large numbers of medications available to treat you. Do not be discouraged if you do not get immediate relief. Sometimes different medications or a combination of medications will be tried before you receive the results you are hoping for. See your caregiver if you have pain that seems to be coming from nowhere and does not go away. Help is available.  SEEK IMMEDIATE MEDICAL CARE IF:   There is a sudden change in the quality of your pain, especially if the change is on only one side of the body.  You notice changes of the skin, such as redness, black or purple discoloration, swelling, or an ulcer.  You cannot move the affected limbs. Document Released: 09/04/2004 Document Revised: 03/01/2012 Document Reviewed: 09/04/2004 [facility] Patient Information 2015 [place], [place]. This information is not intended to replace advice given to you by your health care provider. Make sure you discuss any questions you have with your health care provider.

## 2015-09-24 ENCOUNTER — Encounter: Payer: Self-pay | Admitting: Emergency Medicine

## 2015-09-24 ENCOUNTER — Emergency Department
Admission: EM | Admit: 2015-09-24 | Discharge: 2015-09-24 | Disposition: A | Discharge status: Home / Self Care | Payer: Medicare Other | Attending: Emergency Medicine | Admitting: Emergency Medicine

## 2015-09-24 DIAGNOSIS — E119 Type 2 diabetes mellitus without complications: Secondary | ICD-10-CM | POA: Insufficient documentation

## 2015-09-24 DIAGNOSIS — H6092 Unspecified otitis externa, left ear: Secondary | ICD-10-CM | POA: Insufficient documentation

## 2015-09-24 DIAGNOSIS — I10 Essential (primary) hypertension: Secondary | ICD-10-CM | POA: Diagnosis not present

## 2015-09-24 DIAGNOSIS — Z79899 Other long term (current) drug therapy: Secondary | ICD-10-CM | POA: Diagnosis not present

## 2015-09-24 DIAGNOSIS — H9202 Otalgia, left ear: Secondary | ICD-10-CM | POA: Diagnosis present

## 2015-09-24 MED ORDER — HYDROCODONE-ACETAMINOPHEN 5-325 MG PO TABS: 1.0000 | ORAL_TABLET | Freq: Four times a day (QID) | ORAL | Status: DC | PRN

## 2015-09-24 MED ORDER — OXYCODONE-ACETAMINOPHEN 5-325 MG PO TABS
1.0000 | ORAL_TABLET | Freq: Once | ORAL | Status: AC
  Administered 2015-09-24: 1 via ORAL
  Filled 2015-09-24: qty 1

## 2015-09-24 MED ORDER — CIPROFLOXACIN HCL 500 MG PO TABS: 500.0000 mg | ORAL_TABLET | Freq: Two times a day (BID) | ORAL | Status: DC

## 2015-09-24 MED ORDER — CIPROFLOXACIN-DEXAMETHASONE 0.3-0.1 % OT SUSP: 4.0000 [drp] | Freq: Two times a day (BID) | OTIC | Status: DC

## 2015-09-24 MED ORDER — TRAMADOL HCL 50 MG PO TABS: 100.0000 mg | ORAL_TABLET | Freq: Once | ORAL | Status: DC

## 2015-09-24 MED ORDER — CIPROFLOXACIN-DEXAMETHASONE 0.3-0.1 % OT SUSP
4.0000 [drp] | Freq: Two times a day (BID) | OTIC | Status: DC
  Administered 2015-09-24: 4 [drp] via OTIC
  Filled 2015-09-24: qty 7.5

## 2015-09-24 MED ORDER — CIPROFLOXACIN HCL 500 MG PO TABS
500.0000 mg | ORAL_TABLET | Freq: Once | ORAL | Status: AC
  Administered 2015-09-24: 500 mg via ORAL
  Filled 2015-09-24: qty 1

## 2015-09-24 NOTE — ED Provider Notes (Signed)
[facility] Emergency Department Provider Note ____________________________________________  Time seen: 6301  I have reviewed the triage vital signs and the nursing notes.  HISTORY  Chief Complaint  Otalgia  HPI Fernando Marshall is a 69 y.o. male reports to the ED for evaluation of left ear pain since Thursday. He is rested his daughter attempted to flush the ear with peroxide, thinking he had an impaction of wax. Since that time was noted increased redness, swelling, and pain to the left ear, and face. He denies any interim fevers, chills, sweats. Increased swelling in the ear has worsened his already poor hearing on the left side.  Past Medical History  Diagnosis Date  . Hypertension   . CHF (congestive heart failure) ([place])   . Asthma   . COPD (chronic obstructive pulmonary disease) ([place])   . Coronary artery disease   . Diabetes mellitus without complication ([place])   . Depression   . PE (physical exam), annual   . Collagen vascular disease ([place])     There are no active problems to display for this patient.   Past Surgical History  Procedure Laterality Date  . Ivc filter placement ([facility] hx)      Current Outpatient Rx  Name  Route  Sig  Dispense  Refill  . ciprofloxacin (CIPRO) 500 MG tablet   Oral   Take 1 tablet (500 mg total) by mouth 2 (two) times daily.   20 tablet   0   . ciprofloxacin-dexamethasone (CIPRODEX) otic suspension   Left Ear   Place 4 drops into the left ear 2 (two) times daily.   7.5 mL   0   . diazepam (VALIUM) 2 MG tablet   Oral   Take 1 tablet (2 mg total) by mouth every 8 (eight) hours as needed for anxiety or muscle spasms.   20 tablet   0   . diphenhydrAMINE (BENADRYL) 25 mg capsule   Oral   Take 2 capsules (50 mg total) by mouth every 6 (six) hours as needed.   60 capsule   0   . HYDROcodone-acetaminophen (NORCO) 5-325 MG tablet   Oral   Take 1 tablet by mouth every 6 (six) hours as needed for moderate pain.  12 tablet   0   . metoCLOPramide (REGLAN) 10 MG tablet   Oral   Take 1 tablet (10 mg total) by mouth 4 (four) times daily -  before meals and at bedtime.   60 tablet   0   . ondansetron (ZOFRAN ODT) 8 MG disintegrating tablet   Oral   Take 1 tablet (8 mg total) by mouth every 8 (eight) hours as needed for nausea or vomiting.   20 tablet   0   . oxyCODONE-acetaminophen (ROXICET) 5-325 MG per tablet   Oral   Take 1 tablet by mouth every 6 (six) hours as needed for severe pain.   12 tablet   0     Allergies Metformin and related and Morphine and related  History reviewed. No pertinent family history.  Social History Social History  Substance Use Topics  . Smoking status: Never Smoker   . Smokeless tobacco: None  . Alcohol Use: No   Review of Systems  Constitutional: Negative for fever. Eyes: Negative for visual changes. ENT: Negative for sore throat. Left ear pain and swelling as above. Cardiovascular: Negative for chest pain. Respiratory: Negative for shortness of breath. Gastrointestinal: Negative for abdominal pain, vomiting and diarrhea. Genitourinary: Negative for dysuria. Musculoskeletal:  Negative for back pain. Skin: Negative for rash. Neurological: Negative for headaches, focal weakness or numbness. ____________________________________________  PHYSICAL EXAM:  VITAL SIGNS: ED Triage Vitals  Enc Vitals Group     BP 09/24/15 1531 179/135 mmHg     Pulse Rate 09/24/15 1531 113     Resp 09/24/15 1531 18     Temp 09/24/15 1531 97.8 F (36.6 C)     Temp Source 09/24/15 1531 Oral     SpO2 09/24/15 1531 97 %     Weight --      Height --      Head Cir --      Peak Flow --      Pain Score 09/24/15 1524 8     Pain Loc --      Pain Edu? --      Excl. in [place]? --    Constitutional: Alert and oriented. Well appearing and in no distress. Eyes: Conjunctivae are normal. PERRL. Normal extraocular movements. Ears: Left ear with obvious swelling and erythema to  the pinna. Patient with significant swelling to the ear canal and evaluation. There is also some appreciable purulent and ceruminous drainage from the canal. Patient is also noted to have some increased erythema to the left side of the face. Posterior auricular lymphadenopathy is appreciated on the left.   Head: Normocephalic and atraumatic.   Nose: No congestion/rhinorrhea.   Mouth/Throat: Mucous membranes are moist.   Neck: Supple. No thyromegaly. Hematological/Lymphatic/Immunological: No cervical lymphadenopathy. Cardiovascular: Normal rate, regular rhythm.  Respiratory: Normal respiratory effort. No wheezes/rales/rhonchi. Gastrointestinal: Soft and nontender. No distention. Musculoskeletal: Nontender with normal range of motion in all extremities.  Neurologic:  Normal gait without ataxia. Normal speech and language. No gross focal neurologic deficits are appreciated. Skin:  Skin is warm, dry and intact. No rash noted. Psychiatric: Mood and affect are normal. Patient exhibits appropriate insight and judgment. ____________________________________________  PROCEDURES  Ear wick instilled in left ear. CiproDex 4 gtts left ear Cipro 500 mg po ____________________________________________  INITIAL IMPRESSION / ASSESSMENT AND PLAN / ED COURSE  Acute otitis externa on the left ear with local erythema and cellulitis to the external ear. Patient will be discharged with Cipro twice a day, and Ciprodex drops. He is provided with instruction for use of eardrops with the ear wick. He is to follow with Dr. Kathyrn Sheriff as needed for ongoing symptoms. ____________________________________________  FINAL CLINICAL IMPRESSION(S) / ED DIAGNOSES  Final diagnoses:  Otitis externa, left      Melvenia Needles, PA-C 09/24/15 1603  Daymon Larsen, MD 09/24/15 2146

## 2015-09-24 NOTE — Discharge Instructions (Signed)
Ear Drops You need to put eardrops in your ear. HOME CARE   Put drops in your affected ear as told.  After putting in the drops, lie down with the ear you put the drops in facing up. Stay this way for 10 minutes. Use the ear drops as long as your doctor tells you.  Before you get up, put a cotton ball gently in your ear. Do not push it far in your ear.  Do not wash out your ears unless your doctor says it is okay.  Finish all medicines as told by your doctor. You may be told to keep using the eardrops even if you start to feel better.  See your doctor as told for follow-up visits. GET HELP IF:  You have pain that gets worse.  Any unusual fluid (drainage) is coming from your ear (especially if the fluid stinks).  You have trouble hearing.  You get really dizzy as if the room is spinning and feel sick to your stomach (vertigo).  The outside of your ear becomes red or puffy or both. This may be a sign of an allergic reaction. MAKE SURE YOU:   Understand these instructions.  Will watch your condition.  Will get help right away if you are not doing well or get worse. Document Released: 05/28/2010 Document Revised: 12/13/2013 Document Reviewed: 07/05/2013 [facility] Patient Information 2015 [place], [place]. This information is not intended to replace advice given to you by your health care provider. Make sure you discuss any questions you have with your health care provider.  Otitis Externa Otitis externa is a bacterial or fungal infection of the outer ear canal. This is the area from the eardrum to the outside of the ear. Otitis externa is sometimes called "swimmer's ear." CAUSES  Possible causes of infection include:  Swimming in dirty water.  Moisture remaining in the ear after swimming or bathing.  Mild injury (trauma) to the ear.  Objects stuck in the ear (foreign body).  Cuts or scrapes (abrasions) on the outside of the ear. SIGNS AND SYMPTOMS  The first symptom of  infection is often itching in the ear canal. Later signs and symptoms may include swelling and redness of the ear canal, ear pain, and yellowish-white fluid (pus) coming from the ear. The ear pain may be worse when pulling on the earlobe. DIAGNOSIS  Your health care provider will perform a physical exam. A sample of fluid may be taken from the ear and examined for bacteria or fungi. TREATMENT  Antibiotic ear drops are often given for 10 to 14 days. Treatment may also include pain medicine or corticosteroids to reduce itching and swelling. HOME CARE INSTRUCTIONS   Apply antibiotic ear drops to the ear canal as prescribed by your health care provider.  Take medicines only as directed by your health care provider.  If you have diabetes, follow any additional treatment instructions from your health care provider.  Keep all follow-up visits as directed by your health care provider. PREVENTION   Keep your ear dry. Use the corner of a towel to absorb water out of the ear canal after swimming or bathing.  Avoid scratching or putting objects inside your ear. This can damage the ear canal or remove the protective wax that lines the canal. This makes it easier for bacteria and fungi to grow.  Avoid swimming in lakes, polluted water, or poorly chlorinated pools.  You may use ear drops made of rubbing alcohol and vinegar after swimming. Combine equal parts  of white vinegar and alcohol in a bottle. Put 3 or 4 drops into each ear after swimming. SEEK MEDICAL CARE IF:   You have a fever.  Your ear is still red, swollen, painful, or draining pus after 3 days.  Your redness, swelling, or pain gets worse.  You have a severe headache.  You have redness, swelling, pain, or tenderness in the area behind your ear. MAKE SURE YOU:   Understand these instructions.  Will watch your condition.  Will get help right away if you are not doing well or get worse. Document Released: 12/08/2005 Document  Revised: 04/24/2014 Document Reviewed: 12/25/2011 [facility] Patient Information 2015 [place], [place]. This information is not intended to replace advice given to you by your health care provider. Make sure you discuss any questions you have with your health care provider.  Use the ear drops 2 times daily for the next 7 days. Take the antibiotic as directed until completely gone. Take the pain medicine as needed. Follow-up with Dr. Kathyrn Sheriff for ongoing symptoms and recheck. Call his office to schedule an appointment.

## 2015-10-31 ENCOUNTER — Emergency Department: Payer: Medicare Other

## 2015-10-31 ENCOUNTER — Emergency Department
Admission: EM | Admit: 2015-10-31 | Discharge: 2015-10-31 | Disposition: A | Discharge status: Home / Self Care | Payer: Medicare Other | Attending: Emergency Medicine | Admitting: Emergency Medicine

## 2015-10-31 ENCOUNTER — Encounter: Payer: Self-pay | Admitting: Intensive Care

## 2015-10-31 DIAGNOSIS — E1165 Type 2 diabetes mellitus with hyperglycemia: Secondary | ICD-10-CM | POA: Diagnosis not present

## 2015-10-31 DIAGNOSIS — R739 Hyperglycemia, unspecified: Secondary | ICD-10-CM

## 2015-10-31 DIAGNOSIS — R1110 Vomiting, unspecified: Secondary | ICD-10-CM | POA: Diagnosis not present

## 2015-10-31 DIAGNOSIS — K920 Hematemesis: Secondary | ICD-10-CM | POA: Diagnosis present

## 2015-10-31 DIAGNOSIS — G8929 Other chronic pain: Secondary | ICD-10-CM | POA: Diagnosis not present

## 2015-10-31 DIAGNOSIS — E860 Dehydration: Secondary | ICD-10-CM | POA: Diagnosis not present

## 2015-10-31 DIAGNOSIS — R911 Solitary pulmonary nodule: Secondary | ICD-10-CM | POA: Diagnosis not present

## 2015-10-31 DIAGNOSIS — M549 Dorsalgia, unspecified: Secondary | ICD-10-CM | POA: Insufficient documentation

## 2015-10-31 DIAGNOSIS — Z79899 Other long term (current) drug therapy: Secondary | ICD-10-CM | POA: Insufficient documentation

## 2015-10-31 DIAGNOSIS — J159 Unspecified bacterial pneumonia: Secondary | ICD-10-CM | POA: Insufficient documentation

## 2015-10-31 DIAGNOSIS — I10 Essential (primary) hypertension: Secondary | ICD-10-CM | POA: Diagnosis not present

## 2015-10-31 DIAGNOSIS — Z720 Tobacco use: Secondary | ICD-10-CM | POA: Insufficient documentation

## 2015-10-31 DIAGNOSIS — J189 Pneumonia, unspecified organism: Secondary | ICD-10-CM

## 2015-10-31 HISTORY — DX: Anxiety disorder, unspecified: F41.9

## 2015-10-31 HISTORY — DX: Coalworker's pneumoconiosis: J60

## 2015-10-31 LAB — URINALYSIS COMPLETE WITH MICROSCOPIC (ARMC ONLY)
BACTERIA UA: NONE SEEN
Bilirubin Urine: NEGATIVE
Glucose, UA: >500 mg/dL — AB
Hgb urine dipstick: NEGATIVE
Ketones, ur: NEGATIVE mg/dL
Leukocytes, UA: NEGATIVE
Nitrite: NEGATIVE
PROTEIN: 100 mg/dL — AB
SPECIFIC GRAVITY, URINE: 1.02 (ref 1.005–1.030)
SQUAMOUS EPITHELIAL / LPF: NONE SEEN
pH: 7 (ref 5.0–8.0)

## 2015-10-31 LAB — COMPREHENSIVE METABOLIC PANEL
ALT: 21 U/L (ref 17–63)
AST: 23 U/L (ref 15–41)
Albumin: 4.5 g/dL (ref 3.5–5.0)
Alkaline Phosphatase: 140 U/L — ABNORMAL HIGH (ref 38–126)
Anion gap: 11 (ref 5–15)
BUN: 16 mg/dL (ref 6–20)
CHLORIDE: 97 mmol/L — AB (ref 101–111)
CO2: 25 mmol/L (ref 22–32)
CREATININE: 1.2 mg/dL (ref 0.61–1.24)
Calcium: 9.4 mg/dL (ref 8.9–10.3)
GFR calc Af Amer: >60 mL/min (ref >60)
GFR calc non Af Amer: >60 mL/min (ref >60)
Glucose, Bld: 419 mg/dL — ABNORMAL HIGH (ref 65–99)
Potassium: 3.7 mmol/L (ref 3.5–5.1)
SODIUM: 133 mmol/L — AB (ref 135–145)
Total Bilirubin: 0.7 mg/dL (ref 0.3–1.2)
Total Protein: 8.3 g/dL — ABNORMAL HIGH (ref 6.5–8.1)

## 2015-10-31 LAB — ETHANOL: Alcohol, Ethyl (B): <5 mg/dL (ref <5)

## 2015-10-31 LAB — CBC WITH DIFFERENTIAL/PLATELET
BASOS ABS: 0.1 10*3/uL (ref 0–0.1)
BASOS PCT: 1 %
EOS ABS: 0.2 10*3/uL (ref 0–0.7)
EOS PCT: 2 %
HCT: 47.3 % (ref 40.0–52.0)
Hemoglobin: 15.3 g/dL (ref 13.0–18.0)
Lymphocytes Relative: 19 %
Lymphs Abs: 2.2 10*3/uL (ref 1.0–3.6)
MCH: 26 pg (ref 26.0–34.0)
MCHC: 32.4 g/dL (ref 32.0–36.0)
MCV: 80.3 fL (ref 80.0–100.0)
Monocytes Absolute: 0.5 10*3/uL (ref 0.2–1.0)
Monocytes Relative: 4 %
Neutro Abs: 8.8 10*3/uL — ABNORMAL HIGH (ref 1.4–6.5)
Neutrophils Relative %: 74 %
PLATELETS: 180 10*3/uL (ref 150–440)
RBC: 5.89 MIL/uL (ref 4.40–5.90)
RDW: 16.1 % — ABNORMAL HIGH (ref 11.5–14.5)
WBC: 11.7 10*3/uL — ABNORMAL HIGH (ref 3.8–10.6)

## 2015-10-31 LAB — TROPONIN I

## 2015-10-31 LAB — GLUCOSE, CAPILLARY: GLUCOSE-CAPILLARY: 292 mg/dL — AB (ref 65–99)

## 2015-10-31 LAB — LIPASE, BLOOD: Lipase: 34 U/L (ref 11–51)

## 2015-10-31 MED ORDER — HYDRALAZINE HCL 20 MG/ML IJ SOLN
10.0000 mg | Freq: Once | INTRAMUSCULAR | Status: AC
  Administered 2015-10-31: 10 mg via INTRAVENOUS
  Filled 2015-10-31: qty 1

## 2015-10-31 MED ORDER — ONDANSETRON HCL 4 MG/2ML IJ SOLN
4.0000 mg | Freq: Once | INTRAMUSCULAR | Status: AC
  Administered 2015-10-31: 4 mg via INTRAVENOUS
  Filled 2015-10-31: qty 2

## 2015-10-31 MED ORDER — GLIPIZIDE 5 MG PO TABS: 5.0000 mg | ORAL_TABLET | Freq: Every day | ORAL | Status: AC

## 2015-10-31 MED ORDER — SODIUM CHLORIDE 0.9 % IV BOLUS (SEPSIS)
1000.0000 mL | Freq: Once | INTRAVENOUS | Status: AC
  Administered 2015-10-31: 1000 mL via INTRAVENOUS

## 2015-10-31 MED ORDER — ALBUTEROL SULFATE HFA 108 (90 BASE) MCG/ACT IN AERS: 2.0000 | INHALATION_SPRAY | Freq: Four times a day (QID) | RESPIRATORY_TRACT | Status: AC | PRN

## 2015-10-31 MED ORDER — HYDROMORPHONE HCL 1 MG/ML IJ SOLN
1.0000 mg | Freq: Once | INTRAMUSCULAR | Status: AC
  Administered 2015-10-31: 1 mg via INTRAVENOUS
  Filled 2015-10-31: qty 1

## 2015-10-31 MED ORDER — LORAZEPAM 2 MG/ML IJ SOLN
1.0000 mg | Freq: Once | INTRAMUSCULAR | Status: AC
  Administered 2015-10-31: 1 mg via INTRAVENOUS
  Filled 2015-10-31: qty 1

## 2015-10-31 MED ORDER — IOHEXOL 240 MG/ML SOLN
25.0000 mL | Freq: Once | INTRAMUSCULAR | Status: DC | PRN
  Administered 2015-10-31: 25 mL via ORAL

## 2015-10-31 MED ORDER — HYDROCHLOROTHIAZIDE 25 MG PO TABS
25.0000 mg | ORAL_TABLET | Freq: Every day | ORAL | Status: DC
  Administered 2015-10-31: 25 mg via ORAL
  Filled 2015-10-31 (×2): qty 1

## 2015-10-31 MED ORDER — LEVOFLOXACIN 750 MG PO TABS
750.0000 mg | ORAL_TABLET | Freq: Once | ORAL | Status: AC
  Administered 2015-10-31: 750 mg via ORAL
  Filled 2015-10-31: qty 1

## 2015-10-31 MED ORDER — LISINOPRIL-HYDROCHLOROTHIAZIDE 10-12.5 MG PO TABS: 1.0000 | ORAL_TABLET | Freq: Every day | ORAL | Status: AC

## 2015-10-31 MED ORDER — IPRATROPIUM-ALBUTEROL 0.5-2.5 (3) MG/3ML IN SOLN
3.0000 mL | Freq: Once | RESPIRATORY_TRACT | Status: AC
  Administered 2015-10-31: 3 mL via RESPIRATORY_TRACT
  Filled 2015-10-31: qty 3

## 2015-10-31 MED ORDER — ONDANSETRON HCL 4 MG PO TABS: 4.0000 mg | ORAL_TABLET | Freq: Three times a day (TID) | ORAL | Status: AC | PRN

## 2015-10-31 MED ORDER — IOHEXOL 300 MG/ML  SOLN
100.0000 mL | Freq: Once | INTRAMUSCULAR | Status: AC | PRN
  Administered 2015-10-31: 100 mL via INTRAVENOUS

## 2015-10-31 MED ORDER — LEVOFLOXACIN 750 MG PO TABS: 750.0000 mg | ORAL_TABLET | Freq: Every day | ORAL | Status: AC

## 2015-10-31 MED ORDER — LISINOPRIL 20 MG PO TABS
10.0000 mg | ORAL_TABLET | Freq: Once | ORAL | Status: AC
  Administered 2015-10-31: 10 mg via ORAL
  Filled 2015-10-31: qty 1

## 2015-10-31 NOTE — ED Provider Notes (Signed)
CSN: [id number]     Arrival date & time 10/31/15  1111 History   First MD Initiated Contact with Patient 10/31/15 1125     Chief Complaint  Patient presents with  . Hematemesis     (Consider location/radiation/quality/duration/timing/severity/associated sxs/prior Treatment) The history is provided by the patient.  Fernando Marshall is a 69 y.o. male hx of CHF, HTN, CAD here with vomiting blood. Has some productive cough for several days. He still smokes cigarettes. States that he coughs up some blood-tinged sputum. He had 2 episodes of clear vomiting yesterday. Today he vomited 3 times that is completely bloody with no clots in it. Denies any melena but does have some left-sided abdominal pain. Has some subjective chills as well. Also states that he has chronic back pain and has been out of his oxycodone 30 mg. Has chronic leg numbness that is not getting worse. Has any trouble urinating. Denies any fall or injury to his back. He was seen here recently and finished a course of Valtrex for shingles. Also about 6 weeks ago, he had some shortness of breath and had CT angio that showed no PE. It is not currently on anticoagulation. 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 He lives in a group home.    Past Medical History  Diagnosis Date  . Hypertension   . CHF (congestive heart failure) ([place])   . Asthma   . COPD (chronic obstructive pulmonary disease) ([place])   . Coronary artery disease   . Diabetes mellitus without complication ([place])   . Depression   . PE (physical exam), annual   . Collagen vascular disease ([place])   . Anxiety   . 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 Ivc filter placement ([facility] hx)    . Back surgery    . Cholecystectomy    . Kidney stone surgery    . Ankle reconstruction Left    History reviewed. No pertinent family history. Social History  Substance Use Topics  . Smoking status: Current Every  Day Smoker -- 0.50 packs/day    Types: Cigarettes  . Smokeless tobacco: Never Used  . Alcohol Use: No    Review of Systems  Gastrointestinal: Positive for vomiting and abdominal pain.  All other systems reviewed and are negative.     Allergies  Metformin and related and Morphine and related  Home Medications   Prior to Admission medications   Medication Sig Start Date End Date Taking? Authorizing Provider  hydrOXYzine (ATARAX/VISTARIL) 50 MG tablet Take 50 mg by mouth every 8 (eight) hours as needed.   Yes Historical Provider, MD  lamoTRIgine (LAMICTAL) 25 MG tablet Take 50 mg by mouth 2 (two) times daily.   Yes Historical Provider, MD  QUEtiapine (SEROQUEL) 400 MG tablet Take 400 mg by mouth at bedtime.   Yes Historical Provider, MD  sertraline (ZOLOFT) 100 MG tablet Take 150 mg by mouth daily.   Yes Historical Provider, MD  traZODone (DESYREL) 150 MG tablet Take 150 mg by mouth at bedtime.   Yes Historical Provider, MD   BP 188/97 mmHg  Pulse 81  Temp(Src) 98.6 F (37 C) (Oral)  Resp 22  Ht '5\' 6"'$  (1.676 m)  Wt 187 lb (84.823 kg)  BMI 30.20 kg/m2  SpO2 95% Physical Exam  Constitutional: He is oriented to person, place, and time.  Chronically ill, dehydrated   HENT:  Head: Normocephalic.  MM slightly dry  Eyes: Conjunctivae are normal. Pupils are equal, round, and reactive to light.  Neck: Normal range of motion. Neck supple.  Cardiovascular: Normal rate, regular rhythm and normal heart sounds.   Pulmonary/Chest: Effort normal.  Diminished bilateral bases   Abdominal: Soft. Bowel sounds are normal.  + LLQ tenderness, no rebound   Musculoskeletal: Normal range of motion. He exhibits no edema or tenderness.  Neurological: He is alert and oriented to person, place, and time. No cranial nerve deficit. Coordination normal.  CN 2-12 intact. Nl strength throughout. No saddle anesthesia, dec sensation bilateral feet (chronic parethesias). Nl reflexes   Skin: Skin is warm  and dry.  Psychiatric: He has a normal mood and affect. His behavior is normal. Judgment and thought content normal.  Nursing note and vitals reviewed.   ED Course  Procedures (including critical care time) Labs Review Labs Reviewed  CBC WITH DIFFERENTIAL/PLATELET - Abnormal; Notable for the following:    WBC 11.7 (*)    RDW 16.1 (*)    Neutro Abs 8.8 (*)    All other components within normal limits  COMPREHENSIVE METABOLIC PANEL - Abnormal; Notable for the following:    Sodium 133 (*)    Chloride 97 (*)    Glucose, Bld 419 (*)    Total Protein 8.3 (*)    Alkaline Phosphatase 140 (*)    All other components within normal limits  URINALYSIS COMPLETEWITH MICROSCOPIC ([facility] ONLY) - Abnormal; Notable for the following:    Color, Urine YELLOW (*)    APPearance CLEAR (*)    Glucose, UA >500 (*)    Protein, ur 100 (*)    All other components within normal limits  GLUCOSE, CAPILLARY - Abnormal; Notable for the following:    Glucose-Capillary 292 (*)    All other components within normal limits  LIPASE, BLOOD  TROPONIN I  ETHANOL  CBG MONITORING, ED    Imaging Review Dg Chest 2 View  10/31/2015  CLINICAL DATA:  Hematemesis. Shortness of breath. Weakness. Vomiting. EXAM: CHEST  2 VIEW COMPARISON:  09/03/2015 plain film and CT. FINDINGS: Midline trachea. Normal heart size and mediastinal contours. No pleural effusion or pneumothorax. Interstitial thickening is lower lobe predominant and worse on the right. Similar. No lobar consolidation. IMPRESSION: Peribronchial thickening which may relate to chronic bronchitis or smoking. Peribronchial thickening is somewhat worse on the right, similar to on the prior exam. No acute superimposed process. Electronically Signed   By: Abigail Miyamoto M.D.   On: 10/31/2015 13:05   Ct Abdomen Pelvis W Contrast  10/31/2015  CLINICAL DATA:  69 year old with hematochezia since yesterday evening. EXAM: CT ABDOMEN AND PELVIS WITH CONTRAST TECHNIQUE: Multidetector  CT imaging of the abdomen and pelvis was performed using the standard protocol following bolus administration of intravenous contrast. CONTRAST:  191m OMNIPAQUE IOHEXOL 300 MG/ML  SOLN COMPARISON:  CT the abdomen and pelvis 09/03/2015. FINDINGS: Lower chest: Extensive scarring in the visualized lung bases, particularly in the right lung. In addition, in the lateral segment of the right middle lobe where there is some nodularity, most notable for a 1.4 x 1.0 cm spiculated nodule (image 8 of series 4). In addition, there is a 2.1 x 1.4 cm ground-glass attenuation nodule in the right lower lobe (image 4 of series 4). Atherosclerotic calcifications in the left anterior descending, left circumflex and right coronary arteries. Hepatobiliary: No suspicious cystic or solid hepatic lesions. Mild intrahepatic biliary ductal dilatation. Common bile duct is markedly dilated, measuring up to 1.6 cm in the  porta hepatis, similar to prior examinations. Status post cholecystectomy. Pancreas: No pancreatic mass. No pancreatic ductal dilatation. No pancreatic or peripancreatic fluid or inflammatory changes. There is a small diverticulum from the second portion of the duodenum which extends medially, slightly impinging upon the pancreatic head (unchanged). No surrounding inflammatory changes to suggest an acute diverticulitis. Spleen: Small calcified granuloma in the spleen. Otherwise, unremarkable. Adrenals/Urinary Tract: Bilateral adrenal glands are normal in appearance. Sub cm low-attenuation lesion in the lower pole left kidney is too small to characterize, but likely a cyst. 4.5 x 3 cm low-attenuation lesion in the lower pole of the left kidney is compatible with a large simple cyst. Cortical thinning in the posterior aspect of the lower pole of the right kidney, likely post infectious or inflammatory scarring. No hydroureteronephrosis. Urinary bladder is normal in appearance. Stomach/Bowel: The appearance of the stomach is  normal. No pathologic dilatation of small bowel or colon. Normal appendix. Vascular/Lymphatic: Extensive atherosclerosis throughout the abdominal and pelvic vasculature, without evidence of aneurysm or dissection. Retroaortic left renal vein (normal anatomical variant) incidentally noted. IVC filter in position with tip terminating adjacent to the level of the a left renal vein. Notably, the IVC filter appears to be completely clotted. Inferior vena cava beneath the level of the filter is diminutive, as are the pelvic veins, which appear to feed predominantly into a complex network of collateral veins. IVC is patent above the filter. No lymphadenopathy noted in the abdomen or pelvis. Reproductive: Prostate gland and seminal vesicles are unremarkable in appearance. Other: Mesh for umbilical hernia repair noted. No significant volume of ascites. No pneumoperitoneum. Musculoskeletal: Old compression fracture of the superior endplate of I69 with approximately 20% loss of anterior vertebral body height, which is unchanged. Spinal fusion at L5-S1 (both anteriorly and posteriorly). There are no aggressive appearing lytic or blastic lesions noted in the visualized portions of the skeleton. IMPRESSION: 1. No acute abnormality in the abdomen or pelvis. 2. No definite source for hematochezia identified. 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 This is likely chronic, as the inferior vena cava beneath the filter is diminutive, and there is a well-defined no workup collateral veins bypassing the filter. This may predispose the patient to hemorrhoidal bleeding. 3. Chronic intra and extrahepatic biliary ductal dilatation, similar to the prior study, likely reflective of benign post cholecystectomy physiology. Correlation with liver function tests is suggested. 4. Nodular density in the lateral segment of the right middle lobe measuring 1.4 x 1.0 cm on today's  examination. This is new compared to the prior study 09/03/2015, and favored to be infectious or inflammatory. In addition, however, there is also a 2.1 x 1.4 cm ground-glass attenuation nodule in the right lower lobe, which is unchanged in retrospect compared to the prior examination, and suspicious for potential slow-growing indolent neoplasm such as an adenocarcinoma. For follow-up evaluation of both of these lesions, repeat noncontrast chest CT is suggested in 3 months to ensure the stability or resolution of these findings. 5. Atherosclerosis, including at least 3 vessel coronary artery disease. Please note that although the presence of coronary artery calcium documents the presence of coronary artery disease, the severity of this disease and any potential stenosis cannot be assessed on this non-gated CT examination. Assessment for potential risk factor modification, dietary therapy or pharmacologic therapy may be warranted, if clinically indicated. 6. Additional incidental findings, as above. Electronically Signed   By: Vinnie Langton M.D.   On: 10/31/2015 14:01  I have personally reviewed and evaluated these images and lab results as part of my medical decision-making.   EKG Interpretation None      MDM   Final diagnoses:  Vomiting    Fernando Marshall is a 69 y.o. male here with hematemasis vs hemoptysis. Patient hypertensive likely from pain. Has hx of chronic back pain, I doubt dissection. Consider pancreatitis vs gastritis vs pneumonia vs bronchitis vs hypertensive urgency. Will get labs, CT ab/pel. Will give pain meds and reassess.   2:49 PM Glucose 419 initially, given 1 L NS bolus and came down to 290. Nl AG. Has been on novolog 70/30 before but not on anything now and has metformin allergy. Will start on glipizide 5 mg daily. Also hypertensive 215/107 on arrival but labs and trop neg x 1. Given lisinopril, HCTZ, hydralazine and BP improve to 188/97. Will have him see PCP to recheck  blood sugar and hypertension. CT showed possible pneumonia and CXR showed bronchitis. I wonder if he has hemoptysis from pneumonia. Hg stable, patient not hypoxic. Can be discharged. Does have pulmonary nodule that can be followed up as well.     Wandra Arthurs, MD 10/31/15 [phone]

## 2015-10-31 NOTE — ED Notes (Addendum)
Patient arrived by EMS from [facility]. Patient reports hematemesis starting last night (11/8). Patient has HX of black lung,depression, and anxiety. Patient c/o SOB

## 2015-10-31 NOTE — Discharge Instructions (Signed)
Your blood pressure is elevated. Take lisinopril/HCTZ as prescribed.   Take glipizide 5 mg daily. May need to be changed by your doctor depending on your blood sugar.   Take levaquin daily for a week for pneumonia.  Use albuterol 2 puffs every 6 hrs as need for cough.   You have lung nodule that needs to be followed up.   Take zofran as needed for nausea.   You need to see primary care doctor to follow up your diabetes, hypertension, nodules, pneumonia.  Return to ER if you have worse trouble breathing, coughing up blood, vomiting blood, severe abdominal pain, fevers.

## 2015-12-05 ENCOUNTER — Emergency Department
Admission: EM | Admit: 2015-12-05 | Discharge: 2015-12-05 | Disposition: A | Discharge status: Home / Self Care | Payer: Medicare Other | Attending: Emergency Medicine | Admitting: Emergency Medicine

## 2015-12-05 ENCOUNTER — Encounter: Payer: Self-pay | Admitting: *Deleted

## 2015-12-05 DIAGNOSIS — E119 Type 2 diabetes mellitus without complications: Secondary | ICD-10-CM | POA: Diagnosis not present

## 2015-12-05 DIAGNOSIS — F17210 Nicotine dependence, cigarettes, uncomplicated: Secondary | ICD-10-CM | POA: Insufficient documentation

## 2015-12-05 DIAGNOSIS — F419 Anxiety disorder, unspecified: Secondary | ICD-10-CM | POA: Diagnosis not present

## 2015-12-05 DIAGNOSIS — I10 Essential (primary) hypertension: Secondary | ICD-10-CM | POA: Diagnosis not present

## 2015-12-05 DIAGNOSIS — F329 Major depressive disorder, single episode, unspecified: Secondary | ICD-10-CM | POA: Diagnosis not present

## 2015-12-05 DIAGNOSIS — Z7984 Long term (current) use of oral hypoglycemic drugs: Secondary | ICD-10-CM | POA: Diagnosis not present

## 2015-12-05 DIAGNOSIS — G8929 Other chronic pain: Secondary | ICD-10-CM

## 2015-12-05 DIAGNOSIS — M549 Dorsalgia, unspecified: Secondary | ICD-10-CM | POA: Diagnosis not present

## 2015-12-05 DIAGNOSIS — Z79899 Other long term (current) drug therapy: Secondary | ICD-10-CM | POA: Insufficient documentation

## 2015-12-05 DIAGNOSIS — F32A Depression, unspecified: Secondary | ICD-10-CM

## 2015-12-05 MED ORDER — CYCLOBENZAPRINE HCL 10 MG PO TABS
5.0000 mg | ORAL_TABLET | Freq: Once | ORAL | Status: AC
  Administered 2015-12-05: 5 mg via ORAL
  Filled 2015-12-05: qty 1

## 2015-12-05 NOTE — ED Provider Notes (Signed)
[facility] Emergency Department Provider Note   ____________________________________________  Time seen: 1510  I have reviewed the triage vital signs and the nursing notes.   HISTORY  Chief Complaint Back Pain and Anxiety   History limited by: Not Limited   HPI Fernando Marshall is a 69 y.o. male with history of depression, chronic back pain who presents to the emergency department today because of concerns for back pain and, his nerves. He states that these have been getting worse for the past month. He states his nerves of been particular bad ever since losing one of his 2 daughters. He states that he has seen his therapist at [facility] and was put on Klonopin however he does not feel is sufficient. He denies any SI or HI. He states that he would never harm himself because of his other daughter. He states that in addition he has been having worsening back pain. He states he is allergic to morphine so he has been getting a lot of for his back pain. He denies any fevers.   Past Medical History  Diagnosis Date  . Hypertension   . CHF (congestive heart failure) ([place])   . Asthma   . COPD (chronic obstructive pulmonary disease) ([place])   . Coronary artery disease   . Diabetes mellitus without complication ([place])   . Depression   . PE (physical exam), annual   . Collagen vascular disease ([place])   . Anxiety   . Black lung disease ([place])     There are no active problems to display for this patient.   Past Surgical History  Procedure Laterality Date  . Ivc filter placement ([facility] hx)    . Back surgery    . Cholecystectomy    . Kidney stone surgery    . Ankle reconstruction Left     Current Outpatient Rx  Name  Route  Sig  Dispense  Refill  . albuterol (PROVENTIL HFA;VENTOLIN HFA) 108 (90 BASE) MCG/ACT inhaler   Inhalation   Inhale 2 puffs into the lungs every 6 (six) hours as needed for wheezing or shortness of breath.   1 Inhaler   0   . glipiZIDE  (GLUCOTROL) 5 MG tablet   Oral   Take 1 tablet (5 mg total) by mouth daily.   60 tablet   0   . hydrOXYzine (ATARAX/VISTARIL) 50 MG tablet   Oral   Take 50 mg by mouth every 8 (eight) hours as needed.         . lamoTRIgine (LAMICTAL) 25 MG tablet   Oral   Take 50 mg by mouth 2 (two) times daily.         Marland Kitchen lisinopril-hydrochlorothiazide (ZESTORETIC) 10-12.5 MG tablet   Oral   Take 1 tablet by mouth daily.   30 tablet   0   . ondansetron (ZOFRAN) 4 MG tablet   Oral   Take 1 tablet (4 mg total) by mouth every 8 (eight) hours as needed for nausea or vomiting.   10 tablet   1   . QUEtiapine (SEROQUEL) 400 MG tablet   Oral   Take 400 mg by mouth at bedtime.         . sertraline (ZOLOFT) 100 MG tablet   Oral   Take 150 mg by mouth daily.         . traZODone (DESYREL) 150 MG tablet   Oral   Take 150 mg by mouth at bedtime.  Allergies Metformin and related and Morphine and related  History reviewed. No pertinent family history.  Social History Social History  Substance Use Topics  . Smoking status: Current Every Day Smoker -- 0.50 packs/day    Types: Cigarettes  . Smokeless tobacco: Never Used  . Alcohol Use: No    Review of Systems  Constitutional: Negative for fever. Cardiovascular: Negative for chest pain. Respiratory: Negative for shortness of breath. Gastrointestinal: Negative for abdominal pain, vomiting and diarrhea. Genitourinary: Negative for dysuria. Musculoskeletal: Positive for back pain Neurological: Negative for headaches, focal weakness or numbness.  10-point ROS otherwise negative.  ____________________________________________   PHYSICAL EXAM:  VITAL SIGNS: ED Triage Vitals  Enc Vitals Group     BP 12/05/15 1335 201/107 mmHg     Pulse Rate 12/05/15 1335 78     Resp 12/05/15 1335 18     Temp 12/05/15 1335 99.1 F (37.3 C)     Temp Source 12/05/15 1335 Oral     SpO2 12/05/15 1335 99 %     Weight 12/05/15 1335 198  lb (89.812 kg)     Height 12/05/15 1335 '5\' 6"'$  (1.676 m)     Head Cir --      Peak Flow --      Pain Score 12/05/15 1335 10   Constitutional: Alert and oriented. Tearful, upset Eyes: Conjunctivae are normal. PERRL. Normal extraocular movements. ENT   Head: Normocephalic and atraumatic.   Nose: No congestion/rhinnorhea.   Mouth/Throat: Mucous membranes are moist.   Neck: No stridor. Hematological/Lymphatic/Immunilogical: No cervical lymphadenopathy. Cardiovascular: Normal rate, regular rhythm.  No murmurs, rubs, or gallops. Respiratory: Normal respiratory effort without tachypnea nor retractions. Breath sounds are clear and equal bilaterally. No wheezes/rales/rhonchi. Gastrointestinal: Soft and nontender. No distention. There is no CVA tenderness. Genitourinary: Deferred Musculoskeletal: Normal range of motion in all extremities. No joint effusions.  No lower extremity tenderness nor edema. Neurologic:  Normal speech and language. No gross focal neurologic deficits are appreciated.  Skin:  Skin is warm, dry and intact. No rash noted. Psychiatric: Tearful. Depressed. Denies any SI or HI.  ____________________________________________    LABS (pertinent positives/negatives)  None  ____________________________________________   EKG  None  ____________________________________________    RADIOLOGY  None  ____________________________________________   PROCEDURES  Procedure(s) performed: None  Critical Care performed: No  ____________________________________________   INITIAL IMPRESSION / ASSESSMENT AND PLAN / ED COURSE  Pertinent labs & imaging results that were available during my care of the patient were reviewed by me and considered in my medical decision making (see chart for details).  Patient presents because of concern for his back pain and nerves. On exam patient somewhat tearful. Patient denies any SI or HI. Discussed with patient that he  should follow up with his psychiatrist at [facility]. Currently no indication for IVC given lack of SI/HI. Chronic back pain.  ____________________________________________   FINAL CLINICAL IMPRESSION(S) / ED DIAGNOSES  Final diagnoses:  Anxiety  Depression  Chronic back pain     Nance Pear, MD 12/05/15 1800

## 2015-12-05 NOTE — ED Notes (Addendum)
Pt states hx of back surgrey and states the cool weather is making his back hurt so bad he cant barely walk, states also his daughter died 4 months ago and his nerves cant handle it, pt denies SI or HI, states he was given klonopin but it is not helping

## 2015-12-05 NOTE — Discharge Instructions (Signed)
Please seek medical attention and help for any thoughts about wanting to harm herself, harm others, any concerning change in behavior, severe depression, inappropriate drug use or any other new or concerning symptoms.  Back Exercises The following exercises strengthen the muscles that help to support the back. They also help to keep the lower back flexible. Doing these exercises can help to prevent back pain or lessen existing pain. If you have back pain or discomfort, try doing these exercises 2-3 times each day or as told by your health care provider. When the pain goes away, do them once each day, but increase the number of times that you repeat the steps for each exercise (do more repetitions). If you do not have back pain or discomfort, do these exercises once each day or as told by your health care provider. EXERCISES Single Knee to Chest Repeat these steps 3-5 times for each leg: 1. Lie on your back on a firm bed or the floor with your legs extended. 2. Bring one knee to your chest. Your other leg should stay extended and in contact with the floor. 3. Hold your knee in place by grabbing your knee or thigh. 4. Pull on your knee until you feel a gentle stretch in your lower back. 5. Hold the stretch for 10-30 seconds. 6. Slowly release and straighten your leg. Pelvic Tilt Repeat these steps 5-10 times: 1. Lie on your back on a firm bed or the floor with your legs extended. 2. Bend your knees so they are pointing toward the ceiling and your feet are flat on the floor. 3. Tighten your lower abdominal muscles to press your lower back against the floor. This motion will tilt your pelvis so your tailbone points up toward the ceiling instead of pointing to your feet or the floor. 4. With gentle tension and even breathing, hold this position for 5-10 seconds. Cat-Cow Repeat these steps until your lower back becomes more flexible: 1. Get into a hands-and-knees position on a firm surface. Keep your  hands under your shoulders, and keep your knees under your hips. You may place padding under your knees for comfort. 2. Let your head hang down, and point your tailbone toward the floor so your lower back becomes rounded like the back of a cat. 3. Hold this position for 5 seconds. 4. Slowly lift your head and point your tailbone up toward the ceiling so your back forms a sagging arch like the back of a cow. 5. Hold this position for 5 seconds. Press-Ups Repeat these steps 5-10 times: 1. Lie on your abdomen (face-down) on the floor. 2. Place your palms near your head, about shoulder-width apart. 3. While you keep your back as relaxed as possible and keep your hips on the floor, slowly straighten your arms to raise the top half of your body and lift your shoulders. Do not use your back muscles to raise your upper torso. You may adjust the placement of your hands to make yourself more comfortable. 4. Hold this position for 5 seconds while you keep your back relaxed. 5. Slowly return to lying flat on the floor. Bridges Repeat these steps 10 times: 1. Lie on your back on a firm surface. 2. Bend your knees so they are pointing toward the ceiling and your feet are flat on the floor. 3. Tighten your buttocks muscles and lift your buttocks off of the floor until your waist is at almost the same height as your knees. You should feel the  muscles working in your buttocks and the back of your thighs. If you do not feel these muscles, slide your feet 1-2 inches farther away from your buttocks. 4. Hold this position for 3-5 seconds. 5. Slowly lower your hips to the starting position, and allow your buttocks muscles to relax completely. If this exercise is too easy, try doing it with your arms crossed over your chest. Abdominal Crunches Repeat these steps 5-10 times: 1. Lie on your back on a firm bed or the floor with your legs extended. 2. Bend your knees so they are pointing toward the ceiling and your feet  are flat on the floor. 3. Cross your arms over your chest. 4. Tip your chin slightly toward your chest without bending your neck. 5. Tighten your abdominal muscles and slowly raise your trunk (torso) high enough to lift your shoulder blades a tiny bit off of the floor. Avoid raising your torso higher than that, because it can put too much stress on your low back and it does not help to strengthen your abdominal muscles. 6. Slowly return to your starting position. Back Lifts Repeat these steps 5-10 times: 1. Lie on your abdomen (face-down) with your arms at your sides, and rest your forehead on the floor. 2. Tighten the muscles in your legs and your buttocks. 3. Slowly lift your chest off of the floor while you keep your hips pressed to the floor. Keep the back of your head in line with the curve in your back. Your eyes should be looking at the floor. 4. Hold this position for 3-5 seconds. 5. Slowly return to your starting position. SEEK MEDICAL CARE IF:  Your back pain or discomfort gets much worse when you do an exercise.  Your back pain or discomfort does not lessen within 2 hours after you exercise. If you have any of these problems, stop doing these exercises right away. Do not do them again unless your health care provider says that you can. SEEK IMMEDIATE MEDICAL CARE IF:  You develop sudden, severe back pain. If this happens, stop doing the exercises right away. Do not do them again unless your health care provider says that you can.   This information is not intended to replace advice given to you by your health care provider. Make sure you discuss any questions you have with your health care provider.   Document Released: 01/15/2005 Document Revised: 08/29/2015 Document Reviewed: 02/01/2015 Elsevier Interactive Patient Education [employer].

## 2015-12-05 NOTE — BH Assessment (Signed)
Writer called the number in patient chart, [phone]) to follow up with patient's Group Home. According to the person who answered the phone, states he no longer live at there [place]. Writer called patient nurse and they stated, they have already talked with his current Group Home and have discharged.
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
## 2015-12-27 ENCOUNTER — Encounter: Payer: Self-pay | Admitting: *Deleted

## 2015-12-27 ENCOUNTER — Inpatient Hospital Stay: Payer: Medicare Other | Attending: Cardiothoracic Surgery | Admitting: Cardiothoracic Surgery

## 2015-12-27 ENCOUNTER — Encounter: Payer: Self-pay | Admitting: Cardiothoracic Surgery

## 2015-12-27 VITALS — BP 153/84 | HR 74 | Temp 96.4°F | Ht 66.0 in | Wt 196.7 lb

## 2015-12-27 DIAGNOSIS — R918 Other nonspecific abnormal finding of lung field: Secondary | ICD-10-CM

## 2015-12-27 DIAGNOSIS — J849 Interstitial pulmonary disease, unspecified: Secondary | ICD-10-CM | POA: Insufficient documentation

## 2015-12-27 NOTE — Progress Notes (Signed)
Patient ID: Fernando Marshall, male   DOB: July 09, 1946, 70 y.o.   MRN: [id number]  Chief Complaint  Patient presents with  . Lung Mass    initial visit    Referred By Dr. Lamonte Sakai Reason for Referral right lung mass  HPI Location, Quality, Duration, Severity, Timing, Context, Modifying Factors, Associated Signs and Symptoms.  Fernando Marshall is a 70 y.o. male.  The history is obtained from the patient who is an extremely poor historian. Records are provided to me from [place]. Fernando Marshall is a 70 year old gentleman who underwent a right thoracotomy at [facility] about a year ago. The exact reason for this is unknown. He did then experienced a death in the family and was admitted to a rehabilitation center or a group home. During that visit he was subsequently transferred to [place] for pneumonia and was seen there where he was told he had recurrent lung cancer. This was approximately 2 months ago. However because he loses and arm he elected to have his care transferred here. I have no records from [facility] or from [place] regarding his lung disease. The patient states that he gets very short of breath with minimal activity. He is a lifelong smoker. He does live in a group home and spends most of his time in a relatively sedentary lifestyle. He gets short of breath with minimal activity. He's not had any weight loss. He is unaware of the diagnosis given to him at [facility].   Past Medical History  Diagnosis Date  . Hypertension   . CHF (congestive heart failure) ([place])   . Asthma   . COPD (chronic obstructive pulmonary disease) ([place])   . Coronary artery disease   . Diabetes mellitus without complication ([place])   . Depression   . PE (physical exam), annual   . Collagen vascular disease ([place])   . Anxiety   . Black lung disease [facility])     Past Surgical History  Procedure Laterality Date  . Ivc filter placement ([facility] hx)    . Back surgery    .  Cholecystectomy    . Kidney stone surgery    . Ankle reconstruction Left     Family History  Problem Relation Age of Onset  . Heart attack Mother   . Alzheimer's disease Father     Social History Social History  Substance Use Topics  . Smoking status: Current Every Day Smoker -- 0.50 packs/day    Types: Cigarettes  . Smokeless tobacco: Never Used  . Alcohol Use: No    Allergies  Allergen Reactions  . Metformin And Related   . Morphine And Related     Current Outpatient Prescriptions  Medication Sig Dispense Refill  . albuterol (PROVENTIL HFA;VENTOLIN HFA) 108 (90 BASE) MCG/ACT inhaler Inhale 2 puffs into the lungs every 6 (six) hours as needed for wheezing or shortness of breath. 1 Inhaler 0  . glipiZIDE (GLUCOTROL) 5 MG tablet Take 1 tablet (5 mg total) by mouth daily. 60 tablet 0  . hydrOXYzine (ATARAX/VISTARIL) 50 MG tablet Take 50 mg by mouth every 8 (eight) hours as needed.    . lamoTRIgine (LAMICTAL) 25 MG tablet Take 50 mg by mouth 2 (two) times daily.    Marland Kitchen lisinopril-hydrochlorothiazide (ZESTORETIC) 10-12.5 MG tablet Take 1 tablet by mouth daily. 30 tablet 0  . ondansetron (ZOFRAN) 4 MG tablet Take 1 tablet (4 mg total) by mouth every 8 (eight) hours as needed for nausea or  vomiting. 10 tablet 1  . QUEtiapine (SEROQUEL) 400 MG tablet Take 400 mg by mouth at bedtime.    . sertraline (ZOLOFT) 100 MG tablet Take 150 mg by mouth daily.    . traZODone (DESYREL) 150 MG tablet Take 150 mg by mouth at bedtime.     No current facility-administered medications for this visit.      Review of Systems A complete review of systems was asked and was negative except for the following positive findings weight gain, difficulty with vision, hearing, chest pain, palpitations, shortness of breath, swelling of his legs, coughing up blood, shortness of breath, blood in his stools, joint pain, headaches, depression, anxiety, easy bruising. He also states that he has noticed some swelling  along one of his gallbladder incisions on his abdominal wall.  Blood pressure 153/84, pulse 74, temperature 96.4 F (35.8 C), temperature source Tympanic, height '5\' 6"'$  (1.676 m), weight 196 lb 11.8 oz (89.24 kg), SpO2 98 %.  Physical Exam CONSTITUTIONAL:  Pleasant, well-developed, well-nourished, and in no acute distress. EYES: Pupils equal and reactive to light, Sclera non-icteric EARS, NOSE, MOUTH AND THROAT:  The oropharynx was clear.  Dentition is poor repair.  Oral mucosa pink and moist. LYMPH NODES:  Lymph nodes in the neck and axillae were normal RESPIRATORY:  Lungs were clear.  Normal respiratory effort without pathologic use of accessory muscles of respiration CARDIOVASCULAR: Heart was regular without murmurs.  There were no carotid bruits.  Clubbing.  GI: The abdomen was soft, nontender, and nondistended. There were no palpable masses. There was no hepatosplenomegaly. There were normal bowel sounds in all quadrants. GU:  Rectal deferred.   MUSCULOSKELETAL:  Normal muscle strength and tone.  No cyanosis.   SKIN:  There were no pathologic skin lesions.  There were no nodules on palpation. NEUROLOGIC:  Sensation is normal.  Cranial nerves are grossly intact. PSYCH:  Oriented to person, place and time.  Mood and affect are normal.  Data Reviewed There is a chest CT scan from September 9 and an abdominal CT scan from November  I have personally reviewed the patient's imaging, laboratory findings and medical records.    Assessment    I have reviewed the CT scans from September and November. There is limited information available on the scans. Patient states that he's undergone some lung surgery on the right and there does appear to be staples along the medial aspect of the right lower lobe. There is a ill-defined lesion in the right lower lobe laterally. However this is not well-visualized on the abdominal CT portion.    Plan    I would like to get a complete set of pulmonary  function studies and up-to-date imaging including a CT scan of the chest with contrast. We'll go ahead and set that up. I'll see him back again in next week.       Nestor Lewandowsky, MD 12/27/2015, 3:32 PM

## 2015-12-27 NOTE — Addendum Note (Signed)
Addended by: Nestor Lewandowsky E on: 12/27/2015 04:41 PM   Modules accepted: Orders

## 2016-01-01 ENCOUNTER — Telehealth: Payer: Self-pay | Admitting: *Deleted

## 2016-01-01 NOTE — Progress Notes (Signed)
Met with patient at thoracic surgery appointment. Introduced navigation program and reviewed plan of care. Will follow. 

## 2016-01-01 NOTE — Telephone Encounter (Signed)
Spoke with Mr. Lipford caregiver at the group home. Notified him of PFT appointment on 01/03/2016 at 10 AM.

## 2016-01-03 ENCOUNTER — Other Ambulatory Visit: Payer: Self-pay | Admitting: Cardiothoracic Surgery

## 2016-01-03 ENCOUNTER — Ambulatory Visit: Payer: Medicare Other | Attending: Cardiothoracic Surgery

## 2016-01-03 DIAGNOSIS — Z79899 Other long term (current) drug therapy: Secondary | ICD-10-CM | POA: Diagnosis not present

## 2016-01-03 DIAGNOSIS — F17210 Nicotine dependence, cigarettes, uncomplicated: Secondary | ICD-10-CM | POA: Diagnosis not present

## 2016-01-03 DIAGNOSIS — R918 Other nonspecific abnormal finding of lung field: Secondary | ICD-10-CM | POA: Insufficient documentation

## 2016-01-03 DIAGNOSIS — Z0189 Encounter for other specified special examinations: Secondary | ICD-10-CM | POA: Diagnosis present

## 2016-01-03 MED ORDER — ALBUTEROL SULFATE (2.5 MG/3ML) 0.083% IN NEBU
2.5000 mg | INHALATION_SOLUTION | Freq: Once | RESPIRATORY_TRACT | Status: AC
  Administered 2016-01-03: 2.5 mg via RESPIRATORY_TRACT
  Filled 2016-01-03: qty 3

## 2016-01-07 ENCOUNTER — Ambulatory Visit
Admission: RE | Admit: 2016-01-07 | Discharge: 2016-01-07 | Disposition: A | Discharge status: Home / Self Care | Payer: Medicare Other | Source: Ambulatory Visit | Attending: Cardiothoracic Surgery | Admitting: Cardiothoracic Surgery

## 2016-01-07 DIAGNOSIS — R918 Other nonspecific abnormal finding of lung field: Secondary | ICD-10-CM | POA: Diagnosis present

## 2016-01-07 HISTORY — DX: Disorder of kidney and ureter, unspecified: N28.9

## 2016-01-07 MED ORDER — IOHEXOL 300 MG/ML  SOLN
75.0000 mL | Freq: Once | INTRAMUSCULAR | Status: AC | PRN
  Administered 2016-01-07: 75 mL via INTRAVENOUS

## 2016-01-10 ENCOUNTER — Other Ambulatory Visit: Payer: Self-pay | Admitting: *Deleted

## 2016-01-10 ENCOUNTER — Inpatient Hospital Stay (HOSPITAL_BASED_OUTPATIENT_CLINIC_OR_DEPARTMENT_OTHER): Payer: Medicare Other | Admitting: Cardiothoracic Surgery

## 2016-01-10 VITALS — BP 137/84 | HR 70 | Temp 98.0°F | Wt 202.7 lb

## 2016-01-10 DIAGNOSIS — R918 Other nonspecific abnormal finding of lung field: Secondary | ICD-10-CM

## 2016-01-10 DIAGNOSIS — J849 Interstitial pulmonary disease, unspecified: Secondary | ICD-10-CM | POA: Diagnosis not present

## 2016-01-10 NOTE — Progress Notes (Signed)
Fernando Marshall Inpatient Post-Op Note  Patient ID: Fernando Marshall, male   DOB: Apr 06, 1946, 70 y.o.   MRN: [id number]  HISTORY: Mr. Fernando Marshall comes back today and further follow-up. He did have a chest CT scan made yesterday. Fernando Marshall does continue to smoke and does remain short of breath. He denied any hemoptysis fevers or chills.   Filed Vitals:   01/10/16 1004  BP: 137/84  Pulse: 70  Temp: 98 F (36.7 C)     EXAM: Resp: Lungs are clear bilaterally.  No respiratory distress, normal effort. Heart:  Regular without murmurs Abd:  Abdomen is soft, non distended and non tender. No masses are palpable.  There is no rebound and no guarding.  Neurological: Alert and oriented to person, place, and time. Coordination normal.  Skin: Skin is warm and dry. No rash noted. No diaphoretic. No erythema. No pallor.  Psychiatric: Normal mood and affect. Normal behavior. Judgment and thought content normal.    ASSESSMENT: I have independently reviewed the patient's CT scan. In addition I reviewed the pathology from [facility]. The pathology revealed some ossification present within the lung and some interstitial lung disease but no evidence of malignancy. The CT scan performed here was reviewed. Again there are some fibrotic changes in the lung as well as some interstitial groundglass areas but nothing that was overtly concerning for malignancy. It was the recommendation to follow-up with another CT scan in 3 months.   PLAN:   I would like to obtain a CT scan of the chest in 3 months without contrast. We'll ask the patient to follow-up with his primary care physician Dr. Lamonte Marshall and also with the pulmonologist Dr. Tami Lin.Fernando Marshall.    Fernando Lewandowsky, MD

## 2016-04-10 ENCOUNTER — Ambulatory Visit
Admission: RE | Admit: 2016-04-10 | Discharge: 2016-04-10 | Disposition: A | Discharge status: Home / Self Care | Payer: Medicare Other | Source: Ambulatory Visit | Attending: Cardiothoracic Surgery | Admitting: Cardiothoracic Surgery

## 2016-04-10 ENCOUNTER — Inpatient Hospital Stay: Payer: Medicare Other | Admitting: Cardiothoracic Surgery

## 2016-04-10 DIAGNOSIS — R918 Other nonspecific abnormal finding of lung field: Secondary | ICD-10-CM

## 2016-04-10 DIAGNOSIS — I700 Atherosclerosis of aorta: Secondary | ICD-10-CM | POA: Diagnosis not present

## 2016-04-10 DIAGNOSIS — Z9049 Acquired absence of other specified parts of digestive tract: Secondary | ICD-10-CM | POA: Insufficient documentation

## 2016-04-10 DIAGNOSIS — J9811 Atelectasis: Secondary | ICD-10-CM | POA: Diagnosis not present

## 2016-04-10 DIAGNOSIS — I2510 Atherosclerotic heart disease of native coronary artery without angina pectoris: Secondary | ICD-10-CM | POA: Diagnosis not present

## 2016-04-11 ENCOUNTER — Ambulatory Visit (INDEPENDENT_AMBULATORY_CARE_PROVIDER_SITE_OTHER): Payer: Medicare Other | Admitting: Cardiothoracic Surgery

## 2016-04-11 ENCOUNTER — Encounter: Payer: Self-pay | Admitting: Cardiothoracic Surgery

## 2016-04-11 VITALS — BP 144/73 | HR 118 | Temp 98.3°F | Wt 205.0 lb

## 2016-04-11 DIAGNOSIS — R918 Other nonspecific abnormal finding of lung field: Secondary | ICD-10-CM

## 2016-04-11 DIAGNOSIS — I509 Heart failure, unspecified: Secondary | ICD-10-CM | POA: Diagnosis not present

## 2016-04-11 NOTE — Addendum Note (Signed)
Addended by: Wayna Chalet on: 04/11/2016 11:01 AM   Modules accepted: Orders

## 2016-04-11 NOTE — Progress Notes (Signed)
Fernando Marshall Inpatient Post-Op Note  Patient ID: Fernando Marshall, male   DOB: November 29, 1946, 70 y.o.   MRN: [id number]  HISTORY: Fernando Marshall returns today in follow-up. He continues to complain of severe shortness of breath with minimal activities. He states that walking even a short distance makes him extremely short of breath. Unfortunately he continues to smoke at least a half a pack cigarettes per day. He did have a CT scan of the chest made yesterday which I have independently reviewed.   Filed Vitals:   04/11/16 1016  BP: 144/73  Pulse: 118  Temp: 98.3 F (36.8 C)     EXAM: Resp: Lungs are clear bilaterally.  No respiratory distress, normal effort. Heart:  Regular without murmurs Abd:  Abdomen is soft, non distended and non tender. No masses are palpable.  There is no rebound and no guarding.  Neurological: Alert and oriented to person, place, and time. Coordination normal.  Skin: Skin is warm and dry. No rash noted. No diaphoretic. No erythema. No pallor.  Psychiatric: Normal mood and affect. Normal behavior. Judgment and thought content normal.    ASSESSMENT: I have independently reviewed the patient's CT scan of the chest. The right lower lobe superior segment lesion does appear to be increasing in size. Patient is status post right thoracotomy and wedge resection of a right lower lobe mass at [facility] about a year or so ago. In addition the patient was seen at [facility] were he was told he had lung cancer but those details are unknown to [place]. In any event I do not believe that the patient is an excellent surgical candidate.   PLAN:   We will obtain a CT-guided needle biopsy of the right lower lobe mass. We will check some routine laboratory studies today. I will also make an appointment to see our oncologist.    Nestor Lewandowsky, MD

## 2016-04-14 ENCOUNTER — Telehealth: Payer: Self-pay

## 2016-04-14 DIAGNOSIS — R918 Other nonspecific abnormal finding of lung field: Secondary | ICD-10-CM

## 2016-04-14 NOTE — Telephone Encounter (Signed)
Received notification that patient needs a CT guided Lung Biopsy this week for a Right Lower Lobe Lung Mass. Orders placed. Will be reviewed by radiologist and scheduling will notify patient with appointment details.

## 2016-04-15 ENCOUNTER — Other Ambulatory Visit
Admission: RE | Admit: 2016-04-15 | Discharge: 2016-04-15 | Disposition: A | Discharge status: Home / Self Care | Payer: Medicare Other | Source: Ambulatory Visit | Attending: Cardiothoracic Surgery | Admitting: Cardiothoracic Surgery

## 2016-04-15 DIAGNOSIS — R918 Other nonspecific abnormal finding of lung field: Secondary | ICD-10-CM | POA: Insufficient documentation

## 2016-04-15 LAB — BASIC METABOLIC PANEL
ANION GAP: 11 (ref 5–15)
BUN: 25 mg/dL — ABNORMAL HIGH (ref 6–20)
CO2: 22 mmol/L (ref 22–32)
Calcium: 9.2 mg/dL (ref 8.9–10.3)
Chloride: 100 mmol/L — ABNORMAL LOW (ref 101–111)
Creatinine, Ser: 1.2 mg/dL (ref 0.61–1.24)
GFR calc Af Amer: >60 mL/min (ref >60)
GFR calc non Af Amer: >60 mL/min (ref >60)
GLUCOSE: 386 mg/dL — AB (ref 65–99)
POTASSIUM: 4.4 mmol/L (ref 3.5–5.1)
Sodium: 133 mmol/L — ABNORMAL LOW (ref 135–145)

## 2016-04-15 LAB — CBC WITH DIFFERENTIAL/PLATELET
Basophils Absolute: 0.1 10*3/uL (ref 0–0.1)
Basophils Relative: 1 %
EOS ABS: 0.2 10*3/uL (ref 0–0.7)
EOS PCT: 2 %
HEMATOCRIT: 41.7 % (ref 40.0–52.0)
Hemoglobin: 14.1 g/dL (ref 13.0–18.0)
LYMPHS ABS: 2.2 10*3/uL (ref 1.0–3.6)
LYMPHS PCT: 21 %
MCH: 26.4 pg (ref 26.0–34.0)
MCHC: 33.8 g/dL (ref 32.0–36.0)
MCV: 78 fL — AB (ref 80.0–100.0)
MONO ABS: 0.4 10*3/uL (ref 0.2–1.0)
MONOS PCT: 4 %
Neutro Abs: 7.5 10*3/uL — ABNORMAL HIGH (ref 1.4–6.5)
Neutrophils Relative %: 72 %
Platelets: 178 10*3/uL (ref 150–440)
RBC: 5.35 MIL/uL (ref 4.40–5.90)
RDW: 15.9 % — AB (ref 11.5–14.5)
WBC: 10.5 10*3/uL (ref 3.8–10.6)

## 2016-04-15 NOTE — Telephone Encounter (Signed)
Anticoagulant Clearance faxed to Dr. Sabino Snipes [phone] on Friday. We have not received clearance for patient to be removed from ASA and Xeralto. Call made to their office to check on status of clearance. [phone]. Spoke with Elberta Fortis. Transferred to [employer] who explained that he will need to check with Dr. Gwynneth Aliment to see if this has been signed. Call back number was given to [facility] for a return phone call.

## 2016-04-15 NOTE — Telephone Encounter (Signed)
Dean called back from Dr. Gwynneth Aliment office at this time and states that they did not get the original fax. Asked to refax to [phone].  Refaxed at this time.

## 2016-04-16 ENCOUNTER — Telehealth: Payer: Self-pay

## 2016-04-16 NOTE — Telephone Encounter (Signed)
Spoke with Fernando Marshall from Scheduling whom states that [place] is down and they do not know when this will be available for scheduling until repair person gets here at [place]. Will need to call Pamala Hurry back in am for update on scheduling this.

## 2016-04-16 NOTE — Telephone Encounter (Signed)
Called Fernando Marshall again from [facility] (Dr. Andree Elk office) to ask what was the status of the anti-coagulant form. He stated that he found the fax from last week. He also stated that he would give me a call in an hour in reference to the form being filled out or if patient would need to be seen prior. I gave him our [facility] number so he could call me with an answer.

## 2016-04-16 NOTE — Telephone Encounter (Signed)
Called the [facility] [place] and spoke with [place]. I told her that we needed to schedule an appointment with an Oncologist. She stated that she would have to call me back once she was able to schedule it. I told her that I was at the [facility] office today so to please call me here and she stated that she would.

## 2016-04-16 NOTE — Telephone Encounter (Signed)
Spoke with Marlou Sa at this time and he is faxing the clearance right now.

## 2016-04-16 NOTE — Telephone Encounter (Signed)
Clearance has been obtained and scheduling sheet has been sent to Radiology.

## 2016-04-17 ENCOUNTER — Inpatient Hospital Stay: Payer: Medicare Other | Admitting: Internal Medicine

## 2016-04-17 NOTE — Telephone Encounter (Signed)
Patient and caregiver walked in expecting to have an appointment today with Dr. Caroll Rancher as he was not notified of this being cancelled. Patient was upset about this but I apologized.  Appointment made for patient at [facility] for CT guided biopsy on 04/24/16. He will need to arrive at 0930am at [place]. He will need to be NPO after MN. Patient needs to be off of Xeralto and Aspirin per PCP from 04/23/16-04/25/16. This was explained to patient and caregiver.   We will see patient in clinic on 04/29/16 with Dr. Genevive Bi. Patient will need to be notified of follow-up appointment.

## 2016-04-22 ENCOUNTER — Other Ambulatory Visit: Payer: Self-pay | Admitting: Radiology

## 2016-04-22 NOTE — Telephone Encounter (Signed)
Called and spoke with Colette in reference to patient's referral to be seen by an Oncologist. She stated that patient needed to have the CT Biopsy done first, then [facility] navigator will schedule the appointment with the oncologist.

## 2016-04-23 ENCOUNTER — Other Ambulatory Visit: Payer: Self-pay | Admitting: Radiology

## 2016-04-23 ENCOUNTER — Other Ambulatory Visit: Payer: Self-pay | Admitting: Physician Assistant

## 2016-04-24 ENCOUNTER — Ambulatory Visit (HOSPITAL_COMMUNITY)
Admission: RE | Admit: 2016-04-24 | Discharge: 2016-04-24 | Disposition: A | Discharge status: Home / Self Care | Payer: Medicare Other | Source: Ambulatory Visit | Attending: Cardiothoracic Surgery | Admitting: Cardiothoracic Surgery

## 2016-04-24 ENCOUNTER — Ambulatory Visit (HOSPITAL_COMMUNITY)
Admission: RE | Admit: 2016-04-24 | Discharge: 2016-04-24 | Disposition: A | Discharge status: Home / Self Care | Payer: Medicare Other | Source: Ambulatory Visit | Attending: Diagnostic Radiology | Admitting: Diagnostic Radiology

## 2016-04-24 ENCOUNTER — Encounter (HOSPITAL_COMMUNITY): Payer: Self-pay

## 2016-04-24 DIAGNOSIS — R918 Other nonspecific abnormal finding of lung field: Secondary | ICD-10-CM

## 2016-04-24 DIAGNOSIS — Z9889 Other specified postprocedural states: Secondary | ICD-10-CM | POA: Diagnosis not present

## 2016-04-24 DIAGNOSIS — Z794 Long term (current) use of insulin: Secondary | ICD-10-CM | POA: Diagnosis not present

## 2016-04-24 DIAGNOSIS — I2510 Atherosclerotic heart disease of native coronary artery without angina pectoris: Secondary | ICD-10-CM | POA: Insufficient documentation

## 2016-04-24 DIAGNOSIS — J449 Chronic obstructive pulmonary disease, unspecified: Secondary | ICD-10-CM | POA: Diagnosis not present

## 2016-04-24 DIAGNOSIS — Z7901 Long term (current) use of anticoagulants: Secondary | ICD-10-CM | POA: Diagnosis not present

## 2016-04-24 DIAGNOSIS — Z79899 Other long term (current) drug therapy: Secondary | ICD-10-CM | POA: Diagnosis not present

## 2016-04-24 DIAGNOSIS — C3431 Malignant neoplasm of lower lobe, right bronchus or lung: Secondary | ICD-10-CM | POA: Insufficient documentation

## 2016-04-24 DIAGNOSIS — I509 Heart failure, unspecified: Secondary | ICD-10-CM | POA: Diagnosis not present

## 2016-04-24 DIAGNOSIS — Z7982 Long term (current) use of aspirin: Secondary | ICD-10-CM | POA: Diagnosis not present

## 2016-04-24 DIAGNOSIS — J984 Other disorders of lung: Secondary | ICD-10-CM | POA: Diagnosis present

## 2016-04-24 DIAGNOSIS — E119 Type 2 diabetes mellitus without complications: Secondary | ICD-10-CM | POA: Diagnosis not present

## 2016-04-24 DIAGNOSIS — I110 Hypertensive heart disease with heart failure: Secondary | ICD-10-CM | POA: Insufficient documentation

## 2016-04-24 HISTORY — PX: LUNG BIOPSY: SHX232

## 2016-04-24 LAB — CBC WITH DIFFERENTIAL/PLATELET
BASOS ABS: 0 10*3/uL (ref 0.0–0.1)
Basophils Relative: 1 %
Eosinophils Absolute: 0.1 10*3/uL (ref 0.0–0.7)
Eosinophils Relative: 2 %
HCT: 42.8 % (ref 39.0–52.0)
HEMOGLOBIN: 14.1 g/dL (ref 13.0–17.0)
LYMPHS ABS: 1.6 10*3/uL (ref 0.7–4.0)
Lymphocytes Relative: 19 %
MCH: 26.4 pg (ref 26.0–34.0)
MCHC: 32.9 g/dL (ref 30.0–36.0)
MCV: 80 fL (ref 78.0–100.0)
Monocytes Absolute: 0.4 10*3/uL (ref 0.1–1.0)
Monocytes Relative: 5 %
NEUTROS ABS: 6.5 10*3/uL (ref 1.7–7.7)
Neutrophils Relative %: 73 %
PLATELETS: 146 10*3/uL — AB (ref 150–400)
RBC: 5.35 MIL/uL (ref 4.22–5.81)
RDW: 15 % (ref 11.5–15.5)
WBC: 8.7 10*3/uL (ref 4.0–10.5)

## 2016-04-24 LAB — BASIC METABOLIC PANEL
ANION GAP: 13 (ref 5–15)
BUN: 19 mg/dL (ref 6–20)
CHLORIDE: 99 mmol/L — AB (ref 101–111)
CO2: 22 mmol/L (ref 22–32)
Calcium: 9.2 mg/dL (ref 8.9–10.3)
Creatinine, Ser: 1.34 mg/dL — ABNORMAL HIGH (ref 0.61–1.24)
GFR calc Af Amer: >60 mL/min (ref >60)
GFR, EST NON AFRICAN AMERICAN: 53 mL/min — AB (ref >60)
Glucose, Bld: 314 mg/dL — ABNORMAL HIGH (ref 65–99)
POTASSIUM: 4.6 mmol/L (ref 3.5–5.1)
SODIUM: 134 mmol/L — AB (ref 135–145)

## 2016-04-24 LAB — PROTIME-INR
INR: 1.23 (ref 0.00–1.49)
Prothrombin Time: 15.6 seconds — ABNORMAL HIGH (ref 11.6–15.2)

## 2016-04-24 LAB — GLUCOSE, CAPILLARY
GLUCOSE-CAPILLARY: 326 mg/dL — AB (ref 65–99)
GLUCOSE-CAPILLARY: 255 mg/dL — AB (ref 65–99)

## 2016-04-24 MED ORDER — LIDOCAINE HCL 1 % IJ SOLN
INTRAMUSCULAR | Status: DC
Start: 2016-04-24 — End: 2016-04-25
  Filled 2016-04-24: qty 20

## 2016-04-24 MED ORDER — OXYCODONE-ACETAMINOPHEN 5-325 MG PO TABS
ORAL_TABLET | ORAL | Status: DC
Start: 2016-04-24 — End: 2016-04-25
  Filled 2016-04-24: qty 1

## 2016-04-24 MED ORDER — MIDAZOLAM HCL 2 MG/2ML IJ SOLN
INTRAMUSCULAR | Status: AC | PRN
  Administered 2016-04-24 (×2): 1 mg via INTRAVENOUS

## 2016-04-24 MED ORDER — MIDAZOLAM HCL 2 MG/2ML IJ SOLN
INTRAMUSCULAR | Status: AC
  Filled 2016-04-24: qty 2

## 2016-04-24 MED ORDER — FENTANYL CITRATE (PF) 100 MCG/2ML IJ SOLN
INTRAMUSCULAR | Status: AC | PRN
  Administered 2016-04-24 (×2): 50 ug via INTRAVENOUS

## 2016-04-24 MED ORDER — OXYCODONE-ACETAMINOPHEN 5-325 MG PO TABS
1.0000 | ORAL_TABLET | Freq: Once | ORAL | Status: AC
  Administered 2016-04-24: 1 via ORAL
  Filled 2016-04-24: qty 1

## 2016-04-24 MED ORDER — SODIUM CHLORIDE 0.9 % IV SOLN
INTRAVENOUS | Status: DC
  Administered 2016-04-24: 11:00:00 via INTRAVENOUS

## 2016-04-24 MED ORDER — FENTANYL CITRATE (PF) 100 MCG/2ML IJ SOLN
INTRAMUSCULAR | Status: AC
  Filled 2016-04-24: qty 2

## 2016-04-24 NOTE — Sedation Documentation (Signed)
Patient is resting comfortably. 

## 2016-04-24 NOTE — Sedation Documentation (Signed)
Patient denies pain and is resting comfortably.  

## 2016-04-24 NOTE — H&P (Signed)
Chief Complaint: Patient was seen in consultation today for lung mass biopsy at the request of Oaks,Timothy  Referring Physician(s): Oaks,Timothy  Supervising Physician: Markus Daft  Patient Status: Out-pt  History of Present Illness: Fernando Marshall is a 70 y.o. male with COPD, CAD, DM, CHF, HTN being worked up for right lung nodule. He is referred for CT guided biopsy. PMHx, meds, labs, imaging, allergies reviewed. He normally takes Xarelto for hx of PE, but has not taken it in over 24hr. Last dose was the evening of 5/2. Has been NPO this am. Feels well, no acute c/o  Past Medical History  Diagnosis Date  . Hypertension   . CHF (congestive heart failure) ([place])   . Asthma   . COPD (chronic obstructive pulmonary disease) ([place])   . Coronary artery disease   . Diabetes mellitus without complication ([place])   . Depression   . PE (physical exam), annual   . Anxiety   . 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 Collagen vascular disease ([place])   . Renal insufficiency     Past Surgical History  Procedure Laterality Date  . Ivc filter placement ([facility] hx)    . Back surgery    . Cholecystectomy    . Kidney stone surgery    . Ankle reconstruction Left     Allergies: Metformin and related and Morphine and related  Medications: Prior to Admission medications   Medication Sig Start Date End Date Taking? Authorizing Provider  atorvastatin (LIPITOR) 40 MG tablet Take 40 mg by mouth 1 day or 1 dose. 07/26/14  Yes Historical Provider, MD  gabapentin (NEURONTIN) 300 MG capsule Take 900 mg by mouth 1 day or 1 dose. 02/08/16  Yes Historical Provider, MD  glipiZIDE (GLUCOTROL) 5 MG tablet Take 1 tablet (5 mg total) by mouth daily. 10/31/15 10/30/16 Yes Wandra Arthurs, MD  hydrOXYzine (ATARAX/VISTARIL) 50 MG tablet Take 50 mg by mouth every 8 (eight) hours as needed.   Yes Historical Provider, MD  insulin detemir (LEVEMIR) 100 UNIT/ML injection Inject 70 Units into the skin daily.   Yes Historical Provider,  MD  lamoTRIgine (LAMICTAL) 25 MG tablet Take 50 mg by mouth 2 (two) times daily.   Yes Historical Provider, MD  lisinopril-hydrochlorothiazide (ZESTORETIC) 10-12.5 MG tablet Take 1 tablet by mouth daily. 10/31/15  Yes Wandra Arthurs, MD  Multiple Vitamin (MULTIVITAMIN) tablet Take 1 tablet by mouth 1 day or 1 dose.   Yes Historical Provider, MD  pantoprazole (PROTONIX) 40 MG tablet Take 40 mg by mouth 1 day or 1 dose.   Yes Historical Provider, MD  QUEtiapine (SEROQUEL) 400 MG tablet Take 400 mg by mouth at bedtime.   Yes Historical Provider, MD  sertraline (ZOLOFT) 100 MG tablet Take 150 mg by mouth daily.   Yes Historical Provider, MD  traZODone (DESYREL) 150 MG tablet Take 150 mg by mouth at bedtime.   Yes Historical Provider, MD  vitamin B-12 (CYANOCOBALAMIN) 1000 MCG tablet Take 1 tablet by mouth 1 day or 1 dose.   Yes Historical Provider, MD  albuterol (PROVENTIL HFA;VENTOLIN HFA) 108 (90 BASE) MCG/ACT inhaler Inhale 2 puffs into the lungs every 6 (six) hours as needed for wheezing or shortness of breath. 10/31/15   Wandra Arthurs, MD  aspirin EC 81 MG tablet Take 81 mg by mouth.    Historical Provider, MD  Cholecalciferol (VITAMIN D-1000 MAX ST) 1000 units tablet Take 1 tablet by mouth 1 day or 1 dose.  Historical Provider, MD  LEVEMIR FLEXTOUCH 100 UNIT/ML Pen Inject 55 Units into the skin at bedtime.  02/29/16   Historical Provider, MD  ondansetron (ZOFRAN) 4 MG tablet Take 1 tablet (4 mg total) by mouth every 8 (eight) hours as needed for nausea or vomiting. 10/31/15 10/30/16  Wandra Arthurs, MD  rivaroxaban (XARELTO) 20 MG TABS tablet Take 20 mg by mouth at bedtime.    Historical Provider, MD     Family History  Problem Relation Age of Onset  . Heart attack Mother   . Alzheimer's disease Father     Social History   Social History  . Marital Status: Widowed    Spouse Name: N/A  . Number of Children: N/A  . Years of Education: N/A   Social History Main Topics  . Smoking status: Current  Every Day Smoker -- 0.50 packs/day    Types: Cigarettes  . Smokeless tobacco: Never Used  . Alcohol Use: No  . Drug Use: No  . Sexual Activity: Not Asked   Other Topics Concern  . None   Social History Narrative     Review of Systems: A 12 point ROS discussed and pertinent positives are indicated in the HPI above.  All other systems are negative.  Review of Systems  Vital Signs: BP 145/82 mmHg  Pulse 96  Temp(Src) 98.5 F (36.9 C)  Resp 20  Ht '5\' 6"'$  (1.676 m)  Wt 207 lb (93.895 kg)  BMI 33.43 kg/m2  SpO2 98%  Physical Exam  Constitutional: He is oriented to person, place, and time. He appears well-developed and well-nourished. No distress.  HENT:  Head: Normocephalic.  Mouth/Throat: Oropharynx is clear and moist.  Neck: Normal range of motion. No tracheal deviation present.  Cardiovascular: Normal rate, regular rhythm and normal heart sounds.   Pulmonary/Chest: Effort normal and breath sounds normal. No respiratory distress.  Neurological: He is alert and oriented to person, place, and time.  Psychiatric: He has a normal mood and affect. Judgment normal.    Mallampati Score:  MD Evaluation Airway: WNL Heart: WNL Abdomen: WNL Chest/ Lungs: WNL ASA  Classification: 3 Mallampati/Airway Score: One  Imaging: Ct Chest Wo Contrast  04/10/2016  CLINICAL DATA:  70 year old male with mid chest pain for the past several months. History of congestive heart failure, asthma and COPD. EXAM: CT CHEST WITHOUT CONTRAST TECHNIQUE: Multidetector CT imaging of the chest was performed following the standard protocol without IV contrast. COMPARISON:  Chest CT 01/07/2016. FINDINGS: Mediastinum/Lymph Nodes: Heart size is normal. There is no significant pericardial fluid, thickening or pericardial calcification. There is atherosclerosis of the thoracic aorta, the great vessels of the mediastinum and the coronary arteries, including calcified atherosclerotic plaque in the left main, left  anterior descending, left circumflex and right coronary arteries. No pathologically enlarged mediastinal or hilar lymph nodes. Please note that accurate exclusion of hilar adenopathy is limited on noncontrast CT scans. Circumferential thickening of the distal third of the esophagus. No axillary lymphadenopathy. Lungs/Pleura: In the periphery of the right lower lobe (image 87 of series 3) there is a mass-like area of ground-glass attenuation measuring 2.7 x 3.4 cm which has become progressively more prominent when compared to prior studies. Prior examinations were and part limited by motion, and adjacent atelectasis, which made this area less apparent, however, this does appear persistent since 09/03/2015, and enlarging, and there appear to be potential internal areas that are solid measuring up to 1.3 cm in length (image 86 of series 2). Several other  patchy areas of ground-glass attenuation are also noted throughout the lungs bilaterally, but none of these appear very nodular at this time. Specifically, the previously described 10 mm ground-glass attenuation nodule in the right upper lobe has resolved compared to prior study 01/07/2016. Areas of subsegmental atelectasis and/or scarring are noted throughout the periphery of the right mid to lower lung. No acute consolidative airspace disease. No pleural effusions. Suture line in the posterior aspect of the right lower lobe suggestive of prior wedge resection. Upper Abdomen: Status post cholecystectomy. Musculoskeletal/Soft Tissues: There are no aggressive appearing lytic or blastic lesions noted in the visualized portions of the skeleton. IMPRESSION: 1. While the previously noted small right upper lobe ground-glass attenuation nodule has resolved, there is an area of ground-glass attenuation in the periphery of the right lower lobe which has persisted over several prior examinations dating back to 09/03/2015 and has progressively be come more mass-like in appearance  (currently 2.7 x 3.4 cm), now with potential areas of internal soft tissue measuring up to 13 mm. These findings are concerning for potential slow-growing neoplasm such as an adenocarcinoma, and biopsy or surgical resection is recommended at this time. This recommendation follows the consensus statement: Guidelines for Management of Incidental Pulmonary Nodules Detected on CT Images:From the Fleischner Society 2017; published online before print (10.1148/radiol.4818563149). 2. Atherosclerosis, including U left main and 2 vessel coronary artery disease. Please note that although the presence of coronary artery calcium documents the presence of coronary artery disease, the severity of this disease and any potential stenosis cannot be assessed on this non-gated CT examination. Assessment for potential risk factor modification, dietary therapy or pharmacologic therapy may be warranted, if clinically indicated. These results were called by telephone at the time of interpretation on 04/10/2016 at 11:10 am to Dr. Adonis Huguenin for Dr. Nestor Lewandowsky, who verbally acknowledged these results. Electronically Signed   By: Vinnie Langton M.D.   On: 04/10/2016 11:14    Labs:  CBC:  Recent Labs  09/04/15 1610 10/31/15 1134 04/15/16 1151 04/24/16 1023  WBC 10.5 11.7* 10.5 8.7  HGB 14.1 15.3 14.1 14.1  HCT 43.2 47.3 41.7 42.8  PLT 155 180 178 146*    COAGS:  Recent Labs  09/03/15 1134 04/24/16 1023  INR 1.76 1.23  APTT 24  --     BMP:  Recent Labs  09/04/15 1610 10/31/15 1134 04/15/16 1151 04/24/16 1023  NA 137 133* 133* 134*  K 3.8 3.7 4.4 4.6  CL 104 97* 100* 99*  CO2 '22 25 22 22  '$ GLUCOSE 185* 419* 386* 314*  BUN 16 16 25* 19  CALCIUM 9.0 9.4 9.2 9.2  CREATININE 1.08 1.20 1.20 1.34*  GFRNONAA >60 >60 >60 53*  GFRAA >60 >60 >60 >60    LIVER FUNCTION TESTS:  Recent Labs  09/03/15 1134 09/04/15 1610 10/31/15 1134  BILITOT 0.9 0.5 0.7  AST 38 47* 23  ALT 28 68* 21  ALKPHOS 113 170*  140*  PROT 8.0 7.7 8.3*  ALBUMIN 4.3 4.1 4.5    TUMOR MARKERS: No results for input(s): AFPTM, CEA, CA199, CHROMGRNA in the last 8760 hours.  Assessment and Plan: (R)lung nodule/mass For CT guided biopsy Labs ok Has been off Xarelto as directed. Risks and Benefits discussed with the patient including, but not limited to bleeding, hemoptysis, respiratory failure requiring intubation, infection, pneumothorax requiring chest tube placement, stroke from air embolism or even death. All of the patient's questions were answered, patient is agreeable to proceed. Consent signed and  in chart.    Thank you for this interesting consult.  A copy of this report was sent to the requesting provider on this date.  Electronically Signed: Ascencion Dike 04/24/2016, 11:36 AM   I spent a total of 20 minutes in face to face in clinical consultation, greater than 50% of which was counseling/coordinating care for right lung nodule biopsy

## 2016-04-24 NOTE — Discharge Instructions (Signed)

## 2016-04-24 NOTE — Procedures (Signed)
CT guided core biopsies of right lower lobe ground glass lesion. No immediate complication.  Minimal blood loss.  Will get CXR in one hour.

## 2016-04-28 DIAGNOSIS — I10 Essential (primary) hypertension: Secondary | ICD-10-CM | POA: Insufficient documentation

## 2016-04-28 DIAGNOSIS — F1921 Other psychoactive substance dependence, in remission: Secondary | ICD-10-CM | POA: Insufficient documentation

## 2016-04-28 DIAGNOSIS — J449 Chronic obstructive pulmonary disease, unspecified: Secondary | ICD-10-CM | POA: Insufficient documentation

## 2016-04-28 DIAGNOSIS — E785 Hyperlipidemia, unspecified: Secondary | ICD-10-CM | POA: Insufficient documentation

## 2016-04-28 DIAGNOSIS — F319 Bipolar disorder, unspecified: Secondary | ICD-10-CM | POA: Insufficient documentation

## 2016-04-28 DIAGNOSIS — E119 Type 2 diabetes mellitus without complications: Secondary | ICD-10-CM | POA: Insufficient documentation

## 2016-04-28 DIAGNOSIS — Z8673 Personal history of transient ischemic attack (TIA), and cerebral infarction without residual deficits: Secondary | ICD-10-CM | POA: Insufficient documentation

## 2016-04-28 DIAGNOSIS — G8929 Other chronic pain: Secondary | ICD-10-CM | POA: Insufficient documentation

## 2016-04-28 DIAGNOSIS — F3130 Bipolar disorder, current episode depressed, mild or moderate severity, unspecified: Secondary | ICD-10-CM | POA: Insufficient documentation

## 2016-04-28 DIAGNOSIS — I2510 Atherosclerotic heart disease of native coronary artery without angina pectoris: Secondary | ICD-10-CM | POA: Insufficient documentation

## 2016-04-28 DIAGNOSIS — I509 Heart failure, unspecified: Secondary | ICD-10-CM | POA: Insufficient documentation

## 2016-04-28 DIAGNOSIS — I480 Paroxysmal atrial fibrillation: Secondary | ICD-10-CM | POA: Insufficient documentation

## 2016-04-29 ENCOUNTER — Ambulatory Visit: Payer: Medicare Other | Admitting: Cardiothoracic Surgery

## 2016-05-01 ENCOUNTER — Telehealth: Payer: Self-pay | Admitting: *Deleted

## 2016-05-01 ENCOUNTER — Encounter: Payer: Self-pay | Admitting: *Deleted

## 2016-05-01 NOTE — Telephone Encounter (Signed)
Left voicemail and # in EMR for patient to inform him that his appointment tomorrow is at the [facility], not with Dr. Genevive Bi in [place]. Left contact number on voicemail as well.

## 2016-05-02 ENCOUNTER — Ambulatory Visit: Payer: Medicare Other | Admitting: Cardiothoracic Surgery

## 2016-05-02 ENCOUNTER — Inpatient Hospital Stay: Payer: Medicare Other | Attending: Internal Medicine | Admitting: Internal Medicine

## 2016-05-02 VITALS — BP 149/85 | HR 106 | Temp 96.8°F | Resp 18 | Wt 207.0 lb

## 2016-05-02 DIAGNOSIS — F329 Major depressive disorder, single episode, unspecified: Secondary | ICD-10-CM | POA: Diagnosis not present

## 2016-05-02 DIAGNOSIS — J449 Chronic obstructive pulmonary disease, unspecified: Secondary | ICD-10-CM | POA: Insufficient documentation

## 2016-05-02 DIAGNOSIS — Z794 Long term (current) use of insulin: Secondary | ICD-10-CM | POA: Diagnosis not present

## 2016-05-02 DIAGNOSIS — Z7982 Long term (current) use of aspirin: Secondary | ICD-10-CM | POA: Diagnosis not present

## 2016-05-02 DIAGNOSIS — J45909 Unspecified asthma, uncomplicated: Secondary | ICD-10-CM | POA: Diagnosis not present

## 2016-05-02 DIAGNOSIS — Z79899 Other long term (current) drug therapy: Secondary | ICD-10-CM | POA: Diagnosis not present

## 2016-05-02 DIAGNOSIS — I2510 Atherosclerotic heart disease of native coronary artery without angina pectoris: Secondary | ICD-10-CM | POA: Insufficient documentation

## 2016-05-02 DIAGNOSIS — C3431 Malignant neoplasm of lower lobe, right bronchus or lung: Secondary | ICD-10-CM | POA: Diagnosis not present

## 2016-05-02 DIAGNOSIS — E119 Type 2 diabetes mellitus without complications: Secondary | ICD-10-CM | POA: Insufficient documentation

## 2016-05-02 DIAGNOSIS — F419 Anxiety disorder, unspecified: Secondary | ICD-10-CM | POA: Diagnosis not present

## 2016-05-02 DIAGNOSIS — R590 Localized enlarged lymph nodes: Secondary | ICD-10-CM | POA: Diagnosis not present

## 2016-05-02 DIAGNOSIS — R918 Other nonspecific abnormal finding of lung field: Secondary | ICD-10-CM

## 2016-05-02 DIAGNOSIS — F17210 Nicotine dependence, cigarettes, uncomplicated: Secondary | ICD-10-CM | POA: Diagnosis not present

## 2016-05-02 DIAGNOSIS — I10 Essential (primary) hypertension: Secondary | ICD-10-CM | POA: Insufficient documentation

## 2016-05-02 DIAGNOSIS — I509 Heart failure, unspecified: Secondary | ICD-10-CM | POA: Diagnosis not present

## 2016-05-02 NOTE — Progress Notes (Signed)
[place] CONSULT NOTE  Patient Care Team: Herminio Commons, MD as PCP - General (Family Medicine)  CHIEF COMPLAINTS/PURPOSE OF CONSULTATION:   # JAN 2016- [[facility]] Right lower lobe wedge resection [as per pt]; no chemo/RT  # MAY 2017- RLL ground glass opacity [~3cm; 1.3cm nodular component] s/p CT Bx- well differentiated with lepidic pattern; check EGFR/ROS/ALK;PDL-1   # Smoking quit April 2017  HISTORY OF PRESENTING ILLNESS:  Fernando Marshall 70 y.o.  male long-standing history of smoking and a previous history of lung cancer right side status post resection [no records available; surgery done at [facility] as per patient] noted to have progressive shortness of breath cough in the last many months that led to CT scan that showed a right lower lobe opacity. Patient further underwent CT-guided biopsy of the right lower lobe infiltrate- adenocarcinoma/lipid pattern.  Patient has chronic shortness of breath. Chronic cough. No hemoptysis. Denies any unusual swelling in the legs. Denies any headaches. Otherwise no nausea no vomiting.   ROS: A complete 10 point review of system is done which is negative except mentioned above in history of present illness  MEDICAL HISTORY:  Past Medical History  Diagnosis Date  . Hypertension   . CHF (congestive heart failure) ([place])   . Asthma   . COPD (chronic obstructive pulmonary disease) ([place])   . Coronary artery disease   . Diabetes mellitus without complication ([place])   . Depression   . PE (physical exam), annual   . Anxiety   . Black lung disease ([place])   . Collagen vascular disease ([place])   . Renal insufficiency   . Lung mass   . Lung cancer [facility])     SURGICAL HISTORY: Past Surgical History  Procedure Laterality Date  . Ivc filter placement ([facility] hx)    . Back surgery    . Cholecystectomy    . Kidney stone surgery    . Ankle reconstruction Left   . Lung biopsy  04/24/16    SOCIAL HISTORY: group home... Social History   Social  History  . Marital Status: Widowed    Spouse Name: N/A  . Number of Children: N/A  . Years of Education: N/A   Occupational History  . Not on file.   Social History Main Topics  . Smoking status: Current Every Day Smoker -- 0.50 packs/day    Types: Cigarettes  . Smokeless tobacco: Never Used  . Alcohol Use: No  . Drug Use: No  . Sexual Activity: Not on file   Other Topics Concern  . Not on file   Social History Narrative    FAMILY HISTORY: Family History  Problem Relation Age of Onset  . Heart attack Mother   . Alzheimer's disease Father     ALLERGIES:  is allergic to metformin and related and morphine and related.  MEDICATIONS:  Current Outpatient Prescriptions  Medication Sig Dispense Refill  . albuterol (PROVENTIL HFA;VENTOLIN HFA) 108 (90 BASE) MCG/ACT inhaler Inhale 2 puffs into the lungs every 6 (six) hours as needed for wheezing or shortness of breath. 1 Inhaler 0  . aspirin EC 81 MG tablet Take 81 mg by mouth.    Marland Kitchen atorvastatin (LIPITOR) 40 MG tablet Take 40 mg by mouth 1 day or 1 dose.    . Cholecalciferol (VITAMIN D-1000 MAX ST) 1000 units tablet Take 1 tablet by mouth 1 day or 1 dose.    . gabapentin (NEURONTIN) 300 MG capsule Take 900 mg by mouth 1 day or  1 dose.    Marland Kitchen glipiZIDE (GLUCOTROL) 5 MG tablet Take 1 tablet (5 mg total) by mouth daily. 60 tablet 0  . hydrOXYzine (ATARAX/VISTARIL) 50 MG tablet Take 50 mg by mouth every 8 (eight) hours as needed.    . insulin detemir (LEVEMIR) 100 UNIT/ML injection Inject 70 Units into the skin daily.    Marland Kitchen lamoTRIgine (LAMICTAL) 25 MG tablet Take 50 mg by mouth 2 (two) times daily.    Marland Kitchen LEVEMIR FLEXTOUCH 100 UNIT/ML Pen Inject 55 Units into the skin at bedtime.   2  . lisinopril-hydrochlorothiazide (ZESTORETIC) 10-12.5 MG tablet Take 1 tablet by mouth daily. 30 tablet 0  . Multiple Vitamin (MULTIVITAMIN) tablet Take 1 tablet by mouth 1 day or 1 dose.    . ondansetron (ZOFRAN) 4 MG tablet Take 1 tablet (4 mg total) by  mouth every 8 (eight) hours as needed for nausea or vomiting. 10 tablet 1  . pantoprazole (PROTONIX) 40 MG tablet Take 40 mg by mouth 1 day or 1 dose.    Marland Kitchen QUEtiapine (SEROQUEL) 400 MG tablet Take 400 mg by mouth at bedtime.    . rivaroxaban (XARELTO) 20 MG TABS tablet Take 20 mg by mouth at bedtime.    . sertraline (ZOLOFT) 100 MG tablet Take 150 mg by mouth daily.    . traZODone (DESYREL) 150 MG tablet Take 150 mg by mouth at bedtime.    . vitamin B-12 (CYANOCOBALAMIN) 1000 MCG tablet Take 1 tablet by mouth 1 day or 1 dose.     No current facility-administered medications for this visit.      Marland Kitchen  PHYSICAL EXAMINATION: ECOG PERFORMANCE STATUS: 1  Filed Vitals:   05/02/16 1040  BP: 149/85  Pulse: 106  Temp: 96.8 F (36 C)  Resp: 18   Filed Weights   05/02/16 1040  Weight: 207 lb 0.2 oz (93.9 kg)    GENERAL: Well-nourished well-developed; Alert, no distress and comfortable.   Alone.  EYES: no pallor or icterus OROPHARYNX: no thrush or ulceration; poor dentition. NECK: supple, no masses felt LYMPH:  no palpable lymphadenopathy in the cervical, axillary or inguinal regions LUNGS: c decreased breath sounds nd  No wheeze or crackles HEART/CVS: regular rate & rhythm and no murmurs; No lower extremity edema ABDOMEN: abdomen soft, non-tender and normal bowel sounds Musculoskeletal:no cyanosis of digits and no clubbing  PSYCH: alert & oriented x 3 with fluent speech NEURO: no focal motor/sensory deficits SKIN:  no rashes or significant lesions  LABORATORY DATA:  I have reviewed the data as listed Lab Results  Component Value Date   WBC 8.7 04/24/2016   HGB 14.1 04/24/2016   HCT 42.8 04/24/2016   MCV 80.0 04/24/2016   PLT 146* 04/24/2016    Recent Labs  09/03/15 1134 09/04/15 1610 10/31/15 1134 04/15/16 1151 04/24/16 1023  NA 131* 137 133* 133* 134*  K 4.2 3.8 3.7 4.4 4.6  CL 98* 104 97* 100* 99*  CO2 22 22 25 22 22   GLUCOSE 269* 185* 419* 386* 314*  BUN 12 16  16  25* 19  CREATININE 1.08 1.08 1.20 1.20 1.34*  CALCIUM 9.0 9.0 9.4 9.2 9.2  GFRNONAA >60 >60 >60 >60 53*  GFRAA >60 >60 >60 >60 >60  PROT 8.0 7.7 8.3*  --   --   ALBUMIN 4.3 4.1 4.5  --   --   AST 38 47* 23  --   --   ALT 28 68* 21  --   --  ALKPHOS 113 170* 140*  --   --   BILITOT 0.9 0.5 0.7  --   --     RADIOGRAPHIC STUDIES: I have personally reviewed the radiological images as listed and agreed with the findings in the report. Dg Chest 1 View  04/24/2016  CLINICAL DATA:  Status post right lung biopsy. EXAM: CHEST 1 VIEW COMPARISON:  October 31, 2015. FINDINGS: The heart size and mediastinal contours are within normal limits. No pneumothorax or pleural effusion is noted. Stable peribronchial thickening is noted. No infiltrate or acute pulmonary disease is noted. The visualized skeletal structures are unremarkable. IMPRESSION: No pneumothorax status post right lung biopsy. Electronically Signed   By: Marijo Conception, M.D.   On: 04/24/2016 15:16   Ct Chest Wo Contrast  04/10/2016  CLINICAL DATA:  70 year old male with mid chest pain for the past several months. History of congestive heart failure, asthma and COPD. EXAM: CT CHEST WITHOUT CONTRAST TECHNIQUE: Multidetector CT imaging of the chest was performed following the standard protocol without IV contrast. COMPARISON:  Chest CT 01/07/2016. FINDINGS: Mediastinum/Lymph Nodes: Heart size is normal. There is no significant pericardial fluid, thickening or pericardial calcification. There is atherosclerosis of the thoracic aorta, the great vessels of the mediastinum and the coronary arteries, including calcified atherosclerotic plaque in the left main, left anterior descending, left circumflex and right coronary arteries. No pathologically enlarged mediastinal or hilar lymph nodes. Please note that accurate exclusion of hilar adenopathy is limited on noncontrast CT scans. Circumferential thickening of the distal third of the esophagus. No  axillary lymphadenopathy. Lungs/Pleura: In the periphery of the right lower lobe (image 87 of series 3) there is a mass-like area of ground-glass attenuation measuring 2.7 x 3.4 cm which has become progressively more prominent when compared to prior studies. Prior examinations were and part limited by motion, and adjacent atelectasis, which made this area less apparent, however, this does appear persistent since 09/03/2015, and enlarging, and there appear to be potential internal areas that are solid measuring up to 1.3 cm in length (image 86 of series 2). Several other patchy areas of ground-glass attenuation are also noted throughout the lungs bilaterally, but none of these appear very nodular at this time. Specifically, the previously described 10 mm ground-glass attenuation nodule in the right upper lobe has resolved compared to prior study 01/07/2016. Areas of subsegmental atelectasis and/or scarring are noted throughout the periphery of the right mid to lower lung. No acute consolidative airspace disease. No pleural effusions. Suture line in the posterior aspect of the right lower lobe suggestive of prior wedge resection. Upper Abdomen: Status post cholecystectomy. Musculoskeletal/Soft Tissues: There are no aggressive appearing lytic or blastic lesions noted in the visualized portions of the skeleton. IMPRESSION: 1. While the previously noted small right upper lobe ground-glass attenuation nodule has resolved, there is an area of ground-glass attenuation in the periphery of the right lower lobe which has persisted over several prior examinations dating back to 09/03/2015 and has progressively be come more mass-like in appearance (currently 2.7 x 3.4 cm), now with potential areas of internal soft tissue measuring up to 13 mm. These findings are concerning for potential slow-growing neoplasm such as an adenocarcinoma, and biopsy or surgical resection is recommended at this time. This recommendation follows the  consensus statement: Guidelines for Management of Incidental Pulmonary Nodules Detected on CT Images:From the Fleischner Society 2017; published online before print (10.1148/radiol.2376283151). 2. Atherosclerosis, including U left main and 2 vessel coronary artery disease. Please note that  although the presence of coronary artery calcium documents the presence of coronary artery disease, the severity of this disease and any potential stenosis cannot be assessed on this non-gated CT examination. Assessment for potential risk factor modification, dietary therapy or pharmacologic therapy may be warranted, if clinically indicated. These results were called by telephone at the time of interpretation on 04/10/2016 at 11:10 am to Dr. Adonis Huguenin for Dr. Nestor Lewandowsky, who verbally acknowledged these results. Electronically Signed   By: Vinnie Langton M.D.   On: 04/10/2016 11:14   Ct Biopsy  04/24/2016  INDICATION: Suspicious ground-glass opacity in the right lower lobe. Tissue diagnosis is needed. EXAM: CT BIOPSY OF RIGHT LOWER LOBE LUNG LESION MEDICATIONS: None. ANESTHESIA/SEDATION: Moderate (conscious) sedation was employed during this procedure. A total of Versed 2.0 mg and Fentanyl 100 mcg was administered intravenously. Moderate Sedation Time: 20 minutes. The patient's level of consciousness and vital signs were monitored continuously by radiology nursing throughout the procedure under my direct supervision. FLUOROSCOPY TIME:  None COMPLICATIONS: None immediate. PROCEDURE: Informed written consent was obtained from the patient after a thorough discussion of the procedural risks, benefits and alternatives. All questions were addressed. Maximal Sterile Barrier Technique was utilized including caps, mask, sterile gowns, sterile gloves, sterile drape, hand hygiene and skin antiseptic. A timeout was performed prior to the initiation of the procedure. Patient was placed on his left side with the right side slightly elevated.  Images through the chest were obtained. The right side of the chest was marked. This area was prepped and draped in sterile fashion. Skin was anesthetized with 1% lidocaine. 74 gauge coaxial needle was directed to the lung opacity with CT guidance. Needle position was confirmed within the lesion. Two 18 gauge core biopsies were obtained. Specimens were placed in formalin. 58 gauge coaxial needle was removed with the BioSentry tract sealant. Bandage placed at the puncture site. FINDINGS: Poorly marginated opacity along the periphery of the right lower lobe. Needle was directed into the lesion. Small amount of parenchymal hemorrhage following the core biopsies. No evidence for pneumothorax following the core biopsies. IMPRESSION: CT-guided core biopsies of the right lower lobe lesion. Electronically Signed   By: Markus Daft M.D.   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 Awaiting PET scan for the evaluation of the mediastinal adenopathy. However patient is not a surgical candidate as per thoracic surgery because of previous thoracotomy/wedge resection; and also borderline lung function.   # Discussed with patient that if the PET scan shows no evidence of distant metastases- local radiation would be an option. However it shows distant metastases- systemic therapy would be recommended.   # Patient will follow-up with me one to 2 days after the PET scan to go the results/plan.   # I also discussed with Dr. Faith Rogue. Patient plans to move to [place]; closer to [facility] to live with his daughter. He might end up getting treatments closer to home.   All questions were answered. The patient knows to call the clinic with any problems, questions or concerns. I reviewed the images myself and with the patient in detail.   Thank you Dr. Genevive Bi for allowing me to participate in the care of your pleasant patient. Please do not hesitate to contact  me with questions or concerns in the interim.    Cammie Sickle, MD 05/02/2016 10:59 AM

## 2016-05-02 NOTE — Progress Notes (Signed)
Patient here today as new evaluation regarding lung mass.  Referred by Dr. Genevive Bi.  Patient states he stays out of breath.  Also has COPD.  Patient states he is moving to [place] with his daughter on the 26th of this month.

## 2016-05-07 ENCOUNTER — Telehealth: Payer: Self-pay | Admitting: *Deleted

## 2016-05-07 MED ORDER — DIAZEPAM 5 MG PO TABS: ORAL_TABLET | ORAL | Status: DC

## 2016-05-07 NOTE — Telephone Encounter (Signed)
Faxed pathology request form for egfr/emla4/alk and pdl-1 testing for keytruda to [facility].  Per Colbert Ewing, RN must contact [facility].  RN spoke with Tammy at [facility].  Verbal order given to add these testing to pathology. Read back process performed. I asked her if she wanted the original order faxed to her department. She states that she could accept the verbal orders from Dr. Rogue Bussing.

## 2016-05-07 NOTE — Telephone Encounter (Signed)
Med called in to [facility]

## 2016-05-08 ENCOUNTER — Telehealth: Payer: Self-pay | Admitting: *Deleted

## 2016-05-08 ENCOUNTER — Ambulatory Visit
Admission: RE | Admit: 2016-05-08 | Discharge: 2016-05-08 | Disposition: A | Discharge status: Home / Self Care | Payer: Medicare Other | Source: Ambulatory Visit | Attending: Internal Medicine | Admitting: Internal Medicine

## 2016-05-08 DIAGNOSIS — J984 Other disorders of lung: Secondary | ICD-10-CM | POA: Insufficient documentation

## 2016-05-08 DIAGNOSIS — R918 Other nonspecific abnormal finding of lung field: Secondary | ICD-10-CM

## 2016-05-08 LAB — GLUCOSE, CAPILLARY: Glucose-Capillary: 306 mg/dL — ABNORMAL HIGH (ref 65–99)

## 2016-05-08 NOTE — Telephone Encounter (Signed)
Pet scan unable to be preformed. Blood glucose is 306 today. Must be under 200 to perform procedure. Pt stated to pet scan dept that his blood glucose was 480 last evening. He took his insulin 55 units as directed at bedtime. Pet scan has been r/s for next Tuesday.  Richardson Landry at [facility] [phone]) was notified that pet scan will be r/s.   Pt has an md apt with Dr. Rogue Bussing on Monday 5/22, which will need to be post poned a few days after pet scan.  Msg sent to cancer center scheduling to r/s this md apt.  Pt will also need a RF on the valium for claustrophobia in pet/ct scans. He took his tablets prior to the pet scan.  [facility] notified and this was renewed.  I contacted Dr. Gwynneth Aliment office at [facility] but unable to leave msg during lunch re: patient's uncontrolled blood glucose levels. Pt is on levimir at bedtime and glucotrol in day time.   I will attempt to call the office back after lunch hours today.

## 2016-05-08 NOTE — Telephone Encounter (Signed)
RN able to contact Dr. Gwynneth Aliment office. Spoke with scheduler. RN and Dr. Gwynneth Aliment unavailable. She will pass the msg along to the team. I expressed a return phone call of urgent nature. Pt has uncontrolled blood sugar levels. Needs Dr. Gwynneth Aliment advice/lead on managing blood glucose levels as soon as possible.

## 2016-05-09 ENCOUNTER — Other Ambulatory Visit (HOSPITAL_COMMUNITY)
Admission: RE | Admit: 2016-05-09 | Discharge: 2016-05-09 | Disposition: A | Discharge status: Home / Self Care | Payer: Medicare Other | Source: Ambulatory Visit | Attending: Internal Medicine | Admitting: Internal Medicine

## 2016-05-09 ENCOUNTER — Telehealth: Payer: Self-pay | Admitting: *Deleted

## 2016-05-09 DIAGNOSIS — C3431 Malignant neoplasm of lower lobe, right bronchus or lung: Secondary | ICD-10-CM | POA: Insufficient documentation

## 2016-05-09 NOTE — Telephone Encounter (Signed)
Notified caregiver of appointment with Dr. Rogue Bussing moved to June 1st at 3:45pm

## 2016-05-12 ENCOUNTER — Inpatient Hospital Stay: Payer: Medicare Other | Admitting: Internal Medicine

## 2016-05-13 ENCOUNTER — Encounter: Admission: RE | Admit: 2016-05-13 | Payer: Medicare Other | Source: Ambulatory Visit

## 2016-05-15 ENCOUNTER — Encounter (HOSPITAL_COMMUNITY): Payer: Self-pay

## 2016-05-15 ENCOUNTER — Ambulatory Visit: Payer: Medicare Other | Admitting: Internal Medicine

## 2016-05-16 ENCOUNTER — Encounter (HOSPITAL_COMMUNITY): Payer: Self-pay

## 2016-05-16 ENCOUNTER — Encounter
Admission: RE | Admit: 2016-05-16 | Discharge: 2016-05-16 | Disposition: A | Discharge status: Home / Self Care | Payer: Medicare Other | Source: Ambulatory Visit | Attending: Internal Medicine | Admitting: Internal Medicine

## 2016-05-16 DIAGNOSIS — C439 Malignant melanoma of skin, unspecified: Secondary | ICD-10-CM | POA: Diagnosis not present

## 2016-05-16 LAB — GLUCOSE, CAPILLARY: Glucose-Capillary: 263 mg/dL — ABNORMAL HIGH (ref 65–99)

## 2016-05-20 ENCOUNTER — Telehealth: Payer: Self-pay | Admitting: *Deleted

## 2016-05-20 ENCOUNTER — Encounter (HOSPITAL_COMMUNITY): Payer: Self-pay

## 2016-05-20 NOTE — Telephone Encounter (Addendum)
RN Contacted Dr. Gwynneth Aliment office to follow-up on previous msgs r/t to glucose management.  Per Dr. Gwynneth Aliment receptionist, multiple msgs have been left for the patient and caregiver-Steven. Neither the patient nor caregiver has contacted Dr. Gwynneth Aliment office back.  There was an apt set up for Monday, June 5 at 5pm per Dr. Gwynneth Aliment' office for discussion routine pmd f/u and to have a discussion about on abn glucose.  I asked if this apt could be moved up to a sooner date/time. Dr. Gwynneth Aliment office will re-attempt to contact pt.  RN in cancer center contacted Caregiver via [place] facility.  No answer. msg left for Richardson Landry to call cancer center asap.  Call attempt made to Verdell Face personal cell at [phone].  Richardson Landry acknowledge call. Pt's care discussed. He will personally call Dr. Gwynneth Aliment' office back today to get an apt for glucose mgmt. He verbalized the it is imperative to contact pcp back as soon as possible.  Richardson Landry also mentioned that pt's family would like to move closer to family in [place].  The anticipated move date is approximately around June 15'th. Although the caregiver is uncertain that this is the exact date that the pt will be moving back to [facility] with his family.  Richardson Landry states that he would like to get the glucose mgmt under control and at least a pet scan before the move. He states that the patient will most likely, the patient will need to establish care closer to the new residence to avoid long travel trips to [place] center.  Dr. Rogue Bussing made aware.

## 2016-05-22 ENCOUNTER — Ambulatory Visit: Payer: Medicare Other | Admitting: Internal Medicine

## 2016-05-28 ENCOUNTER — Telehealth: Payer: Self-pay | Admitting: *Deleted

## 2016-05-28 NOTE — Telephone Encounter (Signed)
Spoke with Dr. Rogue Bussing. MD would like pet scan to be scheduled next Monday or Wednesday next week-no later than Wed 06/04/16. I asked scheduling to move the apt with Dr. Rogue Bussing on Monday to a few days after pet scan.  Scheduling also asked to contact provider-Steve Enoch at the group home with these appointments.

## 2016-05-28 NOTE — Telephone Encounter (Signed)
Patient's caregiver at [facility] called. Pt's pcp has increased pt's insulin dose. He did not provide the new dosing on the vm.  However, caregiver would like to see if pet scan could be r/s at this time given the new changes in insulin dosing.  RN left vm for nursing team to contact Fernando Papa, RN back in cancer center to discuss when insulin changes were made and the new dosing regimen. This will determine how soon pet scan can be r/s per Dr. Rogue Bussing.   rn spoke with Fernando Marshall at pcp office.  New changes for insulin note-made on 05/26/16:   Novolog 70/30 Mix 55 units in am breakfast; 55 units at bedtime--This was increased from 50 units bid.  Pt is not on sliding scale. Levimir discontinued per nurse at Dr. Gwynneth Aliment office on 5/3. Pt should not have been on this medication. Pt education reinforced.  Per Fernando Marshall-pt is still on glipizide 5 mg once daily.  Pt saw pcp on 05/26/16.  Pt has another apt on July 3rd. AlC will be performed at day.

## 2016-06-02 ENCOUNTER — Telehealth: Payer: Self-pay | Admitting: *Deleted

## 2016-06-02 ENCOUNTER — Inpatient Hospital Stay: Payer: Medicare Other | Admitting: Internal Medicine

## 2016-06-02 MED ORDER — DIAZEPAM 5 MG PO TABS: ORAL_TABLET | ORAL | Status: AC

## 2016-06-02 NOTE — Telephone Encounter (Signed)
Per Dr B OK to refill. Called in to pharmacy

## 2016-06-02 NOTE — Telephone Encounter (Signed)
States patient is usually given med for nerves prior to scans and needs something sent to [facility] for PET scan tomorrow. He last got diazepam 5 mg to take one 1 hour and again 15 mins prior to scan

## 2016-06-03 ENCOUNTER — Encounter
Admission: RE | Admit: 2016-06-03 | Discharge: 2016-06-03 | Disposition: A | Discharge status: Home / Self Care | Payer: Medicare Other | Source: Ambulatory Visit | Attending: Internal Medicine | Admitting: Internal Medicine

## 2016-06-03 DIAGNOSIS — J984 Other disorders of lung: Secondary | ICD-10-CM | POA: Insufficient documentation

## 2016-06-03 LAB — GLUCOSE, CAPILLARY: Glucose-Capillary: 190 mg/dL — ABNORMAL HIGH (ref 65–99)

## 2016-06-03 MED ORDER — FLUDEOXYGLUCOSE F - 18 (FDG) INJECTION
12.1300 | Freq: Once | INTRAVENOUS | Status: AC | PRN
  Administered 2016-06-03: 12.13 via INTRAVENOUS

## 2016-06-06 ENCOUNTER — Inpatient Hospital Stay: Payer: Medicare Other | Attending: Internal Medicine | Admitting: Internal Medicine

## 2016-06-06 VITALS — BP 151/80 | HR 98 | Temp 98.3°F | Resp 18 | Wt 214.5 lb

## 2016-06-06 DIAGNOSIS — F17210 Nicotine dependence, cigarettes, uncomplicated: Secondary | ICD-10-CM | POA: Insufficient documentation

## 2016-06-06 DIAGNOSIS — C3430 Malignant neoplasm of lower lobe, unspecified bronchus or lung: Secondary | ICD-10-CM | POA: Insufficient documentation

## 2016-06-06 DIAGNOSIS — Z7982 Long term (current) use of aspirin: Secondary | ICD-10-CM | POA: Diagnosis not present

## 2016-06-06 DIAGNOSIS — I2510 Atherosclerotic heart disease of native coronary artery without angina pectoris: Secondary | ICD-10-CM | POA: Diagnosis not present

## 2016-06-06 DIAGNOSIS — I110 Hypertensive heart disease with heart failure: Secondary | ICD-10-CM | POA: Insufficient documentation

## 2016-06-06 DIAGNOSIS — C3431 Malignant neoplasm of lower lobe, right bronchus or lung: Secondary | ICD-10-CM | POA: Insufficient documentation

## 2016-06-06 DIAGNOSIS — J449 Chronic obstructive pulmonary disease, unspecified: Secondary | ICD-10-CM | POA: Insufficient documentation

## 2016-06-06 DIAGNOSIS — Z79899 Other long term (current) drug therapy: Secondary | ICD-10-CM | POA: Insufficient documentation

## 2016-06-06 DIAGNOSIS — F209 Schizophrenia, unspecified: Secondary | ICD-10-CM | POA: Diagnosis not present

## 2016-06-06 DIAGNOSIS — F419 Anxiety disorder, unspecified: Secondary | ICD-10-CM | POA: Diagnosis not present

## 2016-06-06 DIAGNOSIS — E119 Type 2 diabetes mellitus without complications: Secondary | ICD-10-CM | POA: Diagnosis not present

## 2016-06-06 DIAGNOSIS — F329 Major depressive disorder, single episode, unspecified: Secondary | ICD-10-CM | POA: Diagnosis not present

## 2016-06-06 NOTE — Assessment & Plan Note (Addendum)
Adenocarcinoma right lower lobe stage I. PET scan -no evidence of distant metastatic disease. I reviewed the images myself and with the patient and caregiver. Discussed the patient that resection would be the primary treatment - however given patient's prior surgery /smoking history COPD patient not a candidate for resection as per thoracic surgery. As an alternative discussed regarding the use of radiation. Patient adamantly wants surgery ; interested in going to [facility] [previous surgery there]. Patient does not recall the surgeon at [facility] ; caregiver will call [facility]. I also have lung navigator [name] Perkins follow-up /referable to [facility] surgery for timely evaluation. At the patient's request I also tried to reach patient's family/unable to reach.  Patient's info- [phone]- [place]/care giver; [facility].   I also spoke to Dr. Faith Rogue; who still feels patient not a candidate for surgery. Agreeable to referable to [facility].   Patient presented to follow-up with me in approximately 2 months/no labs.

## 2016-06-06 NOTE — Progress Notes (Signed)
Patient here today for PET results.  Patient states he is having severe pain in his right flank area and has not voided all day in spite of drinking 4 large cups of water.

## 2016-06-06 NOTE — Progress Notes (Signed)
[place] OFFICE PROGRESS NOTE  Patient Care Team: Herminio Commons, MD as PCP - General (Family Medicine)  No matching staging information was found for the patient.   Oncology History    # JAN 2016- [[facility]] Right lower lobe wedge resection [as per pt- benign]; no chemo/RT  # MAY 2017- RLL ground glass opacity [~3cm; 1.3cm nodular component] s/p CT Bx- well differentiated with lepidic pattern;  EGFR-MUTANT EXON 20 [T790M- RESISTANT; EXON 21-SENSITIVE ]/ROS/ALK-NEG;PDL-1 -0 expression.   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  Patient's PET scan had to be rescheduled multiple times given his poorly  controlled blood sugars.   Patient has chronic shortness of breath chronic right-sided chest pain. Chronic cough. No hemoptysis. Continues to smoke unfortunately.  No headaches no vision changes. He walks with a cane.   REVIEW OF SYSTEMS:  A complete 10 point review of system is done which is negative except mentioned above/history of present illness.   PAST MEDICAL HISTORY :  Past Medical History  Diagnosis Date  . Hypertension   . CHF (congestive heart failure) ([place])   . Asthma   . COPD (chronic obstructive pulmonary disease) ([place])   . Coronary artery disease   . Diabetes mellitus without complication ([place])   . Depression   . PE (physical exam), annual   . Anxiety   . Black lung disease ([place])   . Collagen vascular disease ([place])   . Renal insufficiency   . Lung mass   . Lung cancer ([place])     PAST SURGICAL HISTORY :   Past Surgical History  Procedure Laterality Date  . Ivc filter placement ([facility] hx)    . Back surgery    . Cholecystectomy    .  Kidney stone surgery    . Ankle reconstruction Left   . Lung biopsy  04/24/16    FAMILY HISTORY :   Family History  Problem Relation Age of Onset  . Heart attack Mother   . Alzheimer's disease Father     SOCIAL HISTORY:   Social History  Substance Use Topics  . Smoking status: Current Every Day Smoker -- 0.50 packs/day    Types: Cigarettes  . Smokeless tobacco: Never Used  . Alcohol Use: No    ALLERGIES:  is allergic to metformin and related and morphine and related.  MEDICATIONS:  Current Outpatient Prescriptions  Medication Sig Dispense Refill  . albuterol (PROVENTIL HFA;VENTOLIN HFA) 108 (90 BASE) MCG/ACT inhaler Inhale 2 puffs into the lungs every 6 (six) hours as needed for wheezing or shortness of breath. 1 Inhaler 0  . aspirin EC 81 MG tablet Take 81 mg by mouth.    Marland Kitchen atorvastatin (LIPITOR) 40 MG tablet Take 40 mg by mouth 1 day or 1 dose.    . Cholecalciferol (VITAMIN D-1000 MAX ST) 1000 units tablet Take 1 tablet by mouth 1 day or 1 dose.    . diazepam (VALIUM) 5 MG tablet Take 1 tablet 1 hour prior to PET scan and 1 tablet 15 mins prior to PET scan as needed 2 tablet 0  . gabapentin (NEURONTIN) 300 MG capsule Take 900 mg by mouth 1 day or 1  dose.    . glipiZIDE (GLUCOTROL) 5 MG tablet Take 1 tablet (5 mg total) by mouth daily. 60 tablet 0  . hydrOXYzine (ATARAX/VISTARIL) 50 MG tablet Take 50 mg by mouth every 8 (eight) hours as needed.    . lamoTRIgine (LAMICTAL) 25 MG tablet Take 50 mg by mouth 2 (two) times daily.    Marland Kitchen lisinopril-hydrochlorothiazide (ZESTORETIC) 10-12.5 MG tablet Take 1 tablet by mouth daily. 30 tablet 0  . Multiple Vitamin (MULTIVITAMIN) tablet Take 1 tablet by mouth 1 day or 1 dose.    . ondansetron (ZOFRAN) 4 MG tablet Take 1 tablet (4 mg total) by mouth every 8 (eight) hours as needed for nausea or vomiting. 10 tablet 1  . pantoprazole (PROTONIX) 40 MG tablet Take 40 mg by mouth 1 day or 1 dose.    Marland Kitchen QUEtiapine (SEROQUEL) 400 MG tablet Take 400 mg  by mouth at bedtime.    . rivaroxaban (XARELTO) 20 MG TABS tablet Take 20 mg by mouth at bedtime.    . sertraline (ZOLOFT) 100 MG tablet Take 150 mg by mouth daily.    . traZODone (DESYREL) 150 MG tablet Take 150 mg by mouth at bedtime.    . vitamin B-12 (CYANOCOBALAMIN) 1000 MCG tablet Take 1 tablet by mouth 1 day or 1 dose.     No current facility-administered medications for this visit.    PHYSICAL EXAMINATION: ECOG PERFORMANCE STATUS: 2 - Symptomatic, <50% confined to bed  BP 151/80 mmHg  Pulse 98  Temp(Src) 98.3 F (36.8 C) (Tympanic)  Resp 18  Wt 214 lb 8.1 oz (97.3 kg)  Filed Weights   06/06/16 1609  Weight: 214 lb 8.1 oz (97.3 kg)    GENERAL: Well-nourished well-developed; Alert, no distress and comfortable. Accompanied by caregiver he walks with a cane. EYES: no pallor or icterus OROPHARYNX: no thrush or ulceration; poor dentition. NECK: supple, no masses felt LYMPH: no palpable lymphadenopathy in the cervical, axillary or inguinal regions LUNGS: c decreased breath sounds nd No wheeze or crackles HEART/CVS: regular rate & rhythm and no murmurs; No lower extremity edema ABDOMEN: abdomen soft, non-tender and normal bowel sounds Musculoskeletal:no cyanosis of digits and no clubbing  PSYCH: alert & oriented x 3 with fluent speech NEURO: no focal motor/sensory deficits SKIN: Multiple ecchymosis noted.  LABORATORY DATA:  I have reviewed the data as listed    Component Value Date/Time   NA 134* 04/24/2016 1023   K 4.6 04/24/2016 1023   CL 99* 04/24/2016 1023   CO2 22 04/24/2016 1023   GLUCOSE 314* 04/24/2016 1023   BUN 19 04/24/2016 1023   CREATININE 1.34* 04/24/2016 1023   CALCIUM 9.2 04/24/2016 1023   PROT 8.3* 10/31/2015 1134   ALBUMIN 4.5 10/31/2015 1134   AST 23 10/31/2015 1134   ALT 21 10/31/2015 1134   ALKPHOS 140* 10/31/2015 1134   BILITOT 0.7 10/31/2015 1134   GFRNONAA 53* 04/24/2016 1023   GFRAA >60 04/24/2016 1023    No results found for:  SPEP, UPEP  Lab Results  Component Value Date   WBC 8.7 04/24/2016   NEUTROABS 6.5 04/24/2016   HGB 14.1 04/24/2016   HCT 42.8 04/24/2016   MCV 80.0 04/24/2016   PLT 146* 04/24/2016      Chemistry      Component Value Date/Time   NA 134* 04/24/2016 1023   K 4.6 04/24/2016 1023   CL 99* 04/24/2016 1023   CO2 22 04/24/2016 1023   BUN 19 04/24/2016 1023   CREATININE  1.34* 04/24/2016 1023      Component Value Date/Time   CALCIUM 9.2 04/24/2016 1023   ALKPHOS 140* 10/31/2015 1134   AST 23 10/31/2015 1134   ALT 21 10/31/2015 1134   BILITOT 0.7 10/31/2015 1134       RADIOGRAPHIC STUDIES: I have personally reviewed the radiological images as listed and agreed with the findings in the report. No results found.   ASSESSMENT & PLAN:  Lung cancer, lower lobe ([place]) Adenocarcinoma right lower lobe stage I. PET scan -no evidence of distant metastatic disease. I reviewed the images myself and with the patient and caregiver. Discussed the patient that resection would be the primary treatment - however given patient's prior surgery /smoking history COPD patient not a candidate for resection as per thoracic surgery. As an alternative discussed regarding the use of radiation. Patient adamantly wants surgery ; interested in going to [facility] [previous surgery there]. Patient does not recall the surgeon at [facility] ; caregiver will call [place]. I also have lung navigator [name] Perkins follow-up /referable to [facility] surgery for timely evaluation. At the patient's request I also tried to reach patient's family/unable to reach.  Patient's info- [phone]- [place]/care giver; [facility].   I also spoke to Dr. Faith Rogue; who still feels patient not a candidate for surgery. Agreeable to referable to [facility].   Patient presented to follow-up with me in approximately 2 months/no labs.    No orders of the defined types were placed in this encounter.    # 25 minutes face-to-face with the patient discussing the  above plan of care; more than 50% of time spent on prognosis/ natural history; counseling and coordination.  All questions were answered. The patient knows to call the clinic with any problems, questions or concerns.     Cammie Sickle, MD 06/06/2016 5:08 PM

## 2016-06-11 ENCOUNTER — Telehealth: Payer: Self-pay | Admitting: *Deleted

## 2016-06-11 NOTE — Telephone Encounter (Signed)
Called to report that he has an appt at [facility] with Surgeon 7/3 and they are requesting a copy of PET on CD as well as a copy of the Path report, please help gather this information and call when ready to pick up

## 2016-06-17 NOTE — Telephone Encounter (Signed)
[name], please follow up- Thx

## 2016-07-11 ENCOUNTER — Emergency Department
Admission: EM | Admit: 2016-07-11 | Discharge: 2016-07-11 | Disposition: A | Discharge status: Home / Self Care | Payer: Medicare Other | Attending: Emergency Medicine | Admitting: Emergency Medicine

## 2016-07-11 ENCOUNTER — Encounter: Payer: Self-pay | Admitting: Emergency Medicine

## 2016-07-11 ENCOUNTER — Emergency Department: Payer: Medicare Other

## 2016-07-11 DIAGNOSIS — M51369 Other intervertebral disc degeneration, lumbar region without mention of lumbar back pain or lower extremity pain: Secondary | ICD-10-CM

## 2016-07-11 DIAGNOSIS — Z7982 Long term (current) use of aspirin: Secondary | ICD-10-CM | POA: Diagnosis not present

## 2016-07-11 DIAGNOSIS — M545 Low back pain: Secondary | ICD-10-CM | POA: Diagnosis present

## 2016-07-11 DIAGNOSIS — G44319 Acute post-traumatic headache, not intractable: Secondary | ICD-10-CM

## 2016-07-11 DIAGNOSIS — E119 Type 2 diabetes mellitus without complications: Secondary | ICD-10-CM | POA: Insufficient documentation

## 2016-07-11 DIAGNOSIS — I509 Heart failure, unspecified: Secondary | ICD-10-CM | POA: Diagnosis not present

## 2016-07-11 DIAGNOSIS — Z85118 Personal history of other malignant neoplasm of bronchus and lung: Secondary | ICD-10-CM | POA: Insufficient documentation

## 2016-07-11 DIAGNOSIS — F329 Major depressive disorder, single episode, unspecified: Secondary | ICD-10-CM | POA: Insufficient documentation

## 2016-07-11 DIAGNOSIS — I2510 Atherosclerotic heart disease of native coronary artery without angina pectoris: Secondary | ICD-10-CM | POA: Insufficient documentation

## 2016-07-11 DIAGNOSIS — J45909 Unspecified asthma, uncomplicated: Secondary | ICD-10-CM | POA: Diagnosis not present

## 2016-07-11 DIAGNOSIS — J449 Chronic obstructive pulmonary disease, unspecified: Secondary | ICD-10-CM | POA: Insufficient documentation

## 2016-07-11 DIAGNOSIS — Z7951 Long term (current) use of inhaled steroids: Secondary | ICD-10-CM | POA: Diagnosis not present

## 2016-07-11 DIAGNOSIS — I110 Hypertensive heart disease with heart failure: Secondary | ICD-10-CM | POA: Insufficient documentation

## 2016-07-11 DIAGNOSIS — W19XXXA Unspecified fall, initial encounter: Secondary | ICD-10-CM

## 2016-07-11 DIAGNOSIS — M5136 Other intervertebral disc degeneration, lumbar region: Secondary | ICD-10-CM

## 2016-07-11 DIAGNOSIS — M5126 Other intervertebral disc displacement, lumbar region: Secondary | ICD-10-CM | POA: Diagnosis not present

## 2016-07-11 LAB — CBC WITH DIFFERENTIAL/PLATELET
BASOS PCT: 1 %
Basophils Absolute: 0.1 10*3/uL (ref 0–0.1)
EOS ABS: 0.2 10*3/uL (ref 0–0.7)
Eosinophils Relative: 1 %
HEMATOCRIT: 41 % (ref 40.0–52.0)
Hemoglobin: 14.3 g/dL (ref 13.0–18.0)
LYMPHS ABS: 1.9 10*3/uL (ref 1.0–3.6)
Lymphocytes Relative: 15 %
MCH: 28.7 pg (ref 26.0–34.0)
MCHC: 34.8 g/dL (ref 32.0–36.0)
MCV: 82.6 fL (ref 80.0–100.0)
MONO ABS: 0.6 10*3/uL (ref 0.2–1.0)
MONOS PCT: 5 %
NEUTROS ABS: 9.9 10*3/uL — AB (ref 1.4–6.5)
Neutrophils Relative %: 78 %
Platelets: 142 10*3/uL — ABNORMAL LOW (ref 150–440)
RBC: 4.97 MIL/uL (ref 4.40–5.90)
RDW: 16.1 % — AB (ref 11.5–14.5)
WBC: 12.7 10*3/uL — ABNORMAL HIGH (ref 3.8–10.6)

## 2016-07-11 LAB — COMPREHENSIVE METABOLIC PANEL
ALBUMIN: 4.1 g/dL (ref 3.5–5.0)
ALK PHOS: 120 U/L (ref 38–126)
ALT: 35 U/L (ref 17–63)
AST: 33 U/L (ref 15–41)
Anion gap: 11 (ref 5–15)
BUN: 35 mg/dL — ABNORMAL HIGH (ref 6–20)
CALCIUM: 9.2 mg/dL (ref 8.9–10.3)
CHLORIDE: 99 mmol/L — AB (ref 101–111)
CO2: 21 mmol/L — AB (ref 22–32)
CREATININE: 1.98 mg/dL — AB (ref 0.61–1.24)
GFR calc Af Amer: 38 mL/min — ABNORMAL LOW (ref >60)
GFR calc non Af Amer: 33 mL/min — ABNORMAL LOW (ref >60)
GLUCOSE: 328 mg/dL — AB (ref 65–99)
Potassium: 4.4 mmol/L (ref 3.5–5.1)
SODIUM: 131 mmol/L — AB (ref 135–145)
Total Bilirubin: 0.4 mg/dL (ref 0.3–1.2)
Total Protein: 7.2 g/dL (ref 6.5–8.1)

## 2016-07-11 LAB — PROTIME-INR
INR: 1.1
Prothrombin Time: 14.4 seconds (ref 11.4–15.0)

## 2016-07-11 MED ORDER — HYDROMORPHONE HCL 1 MG/ML IJ SOLN
1.0000 mg | Freq: Once | INTRAMUSCULAR | Status: AC
  Administered 2016-07-11: 1 mg via INTRAVENOUS
  Filled 2016-07-11: qty 1

## 2016-07-11 MED ORDER — OXYCODONE-ACETAMINOPHEN 5-325 MG PO TABS: 1.0000 | ORAL_TABLET | Freq: Four times a day (QID) | ORAL | Status: DC | PRN

## 2016-07-11 MED ORDER — METHOCARBAMOL 500 MG PO TABS: 500.0000 mg | ORAL_TABLET | Freq: Four times a day (QID) | ORAL | Status: AC

## 2016-07-11 MED ORDER — ONDANSETRON HCL 4 MG/2ML IJ SOLN
4.0000 mg | Freq: Once | INTRAMUSCULAR | Status: AC
  Administered 2016-07-11: 4 mg via INTRAVENOUS
  Filled 2016-07-11: qty 2

## 2016-07-11 NOTE — ED Notes (Signed)
Spoke with Mariella Saa at group home and informed him that patient would be discharged per patient's request.  Informed him that the pt would be discharged at 715pm and would be in the lobby waiting for his ride back to the group home.  Mariella Saa and Manilla from the group home would be there to pick him up.

## 2016-07-11 NOTE — ED Notes (Signed)
Reports falling 2 wks ago, pain in lower back and right hip since

## 2016-07-11 NOTE — ED Notes (Signed)
Pt discharged to home.  Caregiver driving.  Discharge instructions reviewed.  Verbalized understanding.  No questions or concerns at this time.  Teach back verified.  Pt in NAD.  No items left in ED.

## 2016-07-11 NOTE — ED Notes (Signed)
Pt reports falling about a week and a half ago and states he has been having back pain and left hip pain. Patient also states his head has been spinning and that when he fell he did hit his head. Pt states he is on Xarelto for his heart attacks that he's had.  Pt states since he fell he has not been able to walk due to dizziness and the pain. Pain is 10/10.  Pt lives at a group home off of [address].

## 2016-07-11 NOTE — Discharge Instructions (Signed)
Herniated Disk  A herniated disk occurs when a disk in your spine bulges out too far. This condition is also called a ruptured disk or slipped disk. Your spine (backbone) is made up of bones called vertebrae. Between each pair of vertebrae is an oval disk with a soft, spongy center that acts as a shock absorber when you move. The spongy center is surrounded by a tough outer ring.  When you have a herniated disk, the spongy center of the disk bulges out or ruptures through the outer ring. A herniated disk can press on a nerve between your vertebrae and cause pain. A herniated disk can occur anywhere in your back or neck area, but the lower back is the most common spot.  CAUSES   In many cases, a herniated disk occurs just from getting older. As you age, the spongy insides of your disks tend to shrink and dry out. A herniated disk can result from gradual wear and tear. Injury or sudden strain can also cause a herniated disk.   RISK FACTORS  Aging is the main risk factor for a herniated disk. Other risk factors include:   Being a man between the ages of 30 and 50 years.   Having a job that requires heavy lifting, bending, or twisting.   Having a job that requires long hours of driving.   Not getting enough exercise.   Being overweight.   Smoking.  SIGNS AND SYMPTOMS   Signs and symptoms depend on which disk is herniated.   For a herniated disk in the lower back, you may have sharp pain in:    One part of your leg, hip, or buttocks.    The back of your calf.    The top or sole of your foot (sciatica).    For a herniated disk in the neck, you may feel pain:    When you move your neck.    Near or over your shoulder blade.    That moves to your upper arm, forearm, or fingers.    You may also have muscle weakness. It may be hard to:    Lift your leg or arm.    Stand on your toes.    Squeeze tightly with one of your hands.   Other symptoms can include:    Numbness or tingling in the affected areas of your body.     Loss of bladder or bowel control. This is a rare but serious sign of a severe herniated disk in the lower back.  DIAGNOSIS   Your health care provider will do a physical exam. During this exam, you may have to move certain body parts or assume various positions. For example, your health care provider may do the straight-leg test. This is a good way to test for a herniated disk in your lower back. In this test, the health care provider lifts your leg while you lie on your back. This is to see if you feel pain down your leg. Your health care provider will also check for numbness or loss of feeling.   Your health care provider will also check your:   Reflexes.   Muscle strength.   Posture.   Other tests may be done to help in making a diagnosis. These may include:   An X-ray of the spine to rule out other causes of back pain.    Other imaging studies, such as an MRI or CT scan. This is to check whether the herniated disk is   pressing on your spinal canal.   Electromyography (EMG). This test checks the nerves that control muscles. It is sometimes used to identify the specific area of nerve involvement.   TREATMENT   In many cases, herniated disk symptoms go away over a period of days or weeks. You will most likely be free of symptoms in 3-4 months. Treatment may include the following:   The initial treatment for a herniated disk is ashort period of rest.    Bed rest is often limited to 1 or 2 days. Resting for too long delays recovery.    If you have a herniated disk in your lower back, you should avoid sitting as much as possible because sitting increases pressure on the disk.   Medicines. These may include:     Nonsteroidal anti-inflammatory drugs (NSAIDs).    Muscle relaxants for back spasms.    Narcotic pain medicine if your pain is very bad.    Steroid injections. You may need these along the involved nerve root to help control pain. The steroid is injected in the area of the herniated disk. It  helps by reducing swelling around the disk.   Physical therapy. This may include exercises to strengthen the muscles that help support your spine.    You may need surgery if other treatments do not work.   HOME CARE INSTRUCTIONS  Follow all your health care provider's instructions. These may include:   Take all medicines as directed by your health care provider.   Rest for 2 days and then start moving.   Do not sit or stand for long periods of time.   Maintain good posture when sitting and standing.   Avoid movements that cause pain, such as bending or lifting.   When you are able to start lifting things again:   Bend with your knees.   Keep your back straight.   Hold heavy objects close to your body.   If you are overweight, ask your health care provider to help you start a weight-loss program.   When you are able to start exercising, ask your health care provider how much and what type of exercise is best for you.   Work with a physical therapist on stretching and strengthening exercises for your back.   Do not wear high-heeled shoes.   Do not sleep on your belly.   Do not smoke.   Keep all follow-up visits as directed by your health care provider.  SEEK MEDICAL CARE IF:   You have back or neck pain that is not getting better after 4 weeks.   You have very bad pain in your back or neck.   You develop numbness, tingling, or weakness along with pain.  SEEK IMMEDIATE MEDICAL CARE IF:    You have numbness, tingling, or weakness that makes you unable to use your arms or legs.   You lose control of your bladder or bowels.   You have dizziness or fainting.   You have shortness of breath.   MAKE SURE YOU:    Understand these instructions.   Will watch your condition.   Will get help right away if you are not doing well or get worse.     This information is not intended to replace advice given to you by your health care provider. Make sure you discuss any questions you have with your health  care provider.     Document Released: 12/05/2000 Document Revised: 12/29/2014 Document Reviewed: 11/11/2013  [employer] Interactive Patient Education   2016 [employer]

## 2016-07-11 NOTE — ED Provider Notes (Signed)
[facility] Emergency Department Provider Note  ____________________________________________  Time seen: Approximately 4:15 PM  I have reviewed the triage vital signs and the nursing notes.   HISTORY  Chief Complaint Back Pain    HPI Fernando Marshall is a 70 y.o. male who presents emergency Department with multiple medical complaints. Patient states that approximately 2 weeks ago he became dizzy, slipped following down a flight of stairs. Patient has had a did not lose consciousness at time. Patient reports being on several toe for previous heart condition. Patient has complained of headache and visual acuity changes since time of accident. Patient also reports lower back pain with numbness in bilateral lower extremities. Patient has a history of surgical procedure with hardware to lower back. Patient reports intense pain to this region with numbness in bilateral lower extremities. He denies any saddle anesthesia, bowel or bladder dysfunction, paresthesias. Patient has a history of significant lung cancer and is awaiting lung resection to the right side to same. Patient reports headache, visual changes, low back pain, numbness in bilateral lower extremity's. Pain is severe, constant, worse with movement. Patient denies any fevers or chills, neck pain, chest pain, shortness of breath, abdominal pain, nausea or vomiting, bowel or bladder dysfunction.   Past Medical History  Diagnosis Date  . Hypertension   . CHF (congestive heart failure) ([place])   . Asthma   . COPD (chronic obstructive pulmonary disease) ([place])   . Coronary artery disease   . Diabetes mellitus without complication ([place])   . Depression   . PE (physical exam), annual   . Anxiety   . Black lung disease ([place])   . Collagen vascular disease ([place])   . Renal insufficiency   . Lung mass   . Lung cancer [facility])     Patient Active Problem List   Diagnosis Date Noted  . Lung cancer, lower lobe ([place]) 06/06/2016   . Bipolar affective disorder ([place]) 04/28/2016  . Bipolar affective disorder, current episode depressed ([place]) 04/28/2016  . Arteriosclerosis of coronary artery 04/28/2016  . Congestive heart failure ([employer]) 04/28/2016  . Chronic pain 04/28/2016  . Chronic obstructive pulmonary disease ([place]) 04/28/2016  . Diabetes mellitus ([place]) 04/28/2016  . Dyslipidemia 04/28/2016  . Cerebrovascular accident, old 04/28/2016  . History of drug dependence ([place]) 04/28/2016  . BP (high blood pressure) 04/28/2016  . Paroxysmal atrial fibrillation ([place]) 04/28/2016  . Type 2 diabetes mellitus ([place]) 04/28/2016  . Anxiety 07/09/2015  . Clinical depression 07/09/2015  . Chest pain 07/24/2014  . 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 Cocaine abuse in remission 02/20/2011  . HLD (hyperlipidemia) 02/20/2011  . Opioid dependence ([place]) 02/11/2011    Past Surgical History  Procedure Laterality Date  . Ivc filter placement ([facility] hx)    . Back surgery    . Cholecystectomy    . Kidney stone surgery    . Ankle reconstruction Left   . Lung biopsy  04/24/16    Current Outpatient Rx  Name  Route  Sig  Dispense  Refill  . albuterol (PROVENTIL HFA;VENTOLIN HFA) 108 (90 BASE) MCG/ACT inhaler   Inhalation   Inhale 2 puffs into the lungs every 6 (six) hours as needed for wheezing or shortness of breath.   1 Inhaler   0   . aspirin EC 81 MG tablet   Oral   Take 81 mg by mouth.         Marland Kitchen atorvastatin (LIPITOR) 40 MG tablet   Oral  Take 40 mg by mouth 1 day or 1 dose.         . Cholecalciferol (VITAMIN D-1000 MAX ST) 1000 units tablet   Oral   Take 1 tablet by mouth 1 day or 1 dose.         . diazepam (VALIUM) 5 MG tablet      Take 1 tablet 1 hour prior to PET scan and 1 tablet 15 mins prior to PET scan as needed   2 tablet   0   . gabapentin (NEURONTIN) 300 MG capsule   Oral   Take 900 mg by mouth 1 day or 1 dose.         Marland Kitchen glipiZIDE (GLUCOTROL) 5 MG tablet   Oral   Take 1 tablet  (5 mg total) by mouth daily.   60 tablet   0   . hydrOXYzine (ATARAX/VISTARIL) 50 MG tablet   Oral   Take 50 mg by mouth every 8 (eight) hours as needed.         . lamoTRIgine (LAMICTAL) 25 MG tablet   Oral   Take 50 mg by mouth 2 (two) times daily.         Marland Kitchen lisinopril-hydrochlorothiazide (ZESTORETIC) 10-12.5 MG tablet   Oral   Take 1 tablet by mouth daily.   30 tablet   0   . methocarbamol (ROBAXIN) 500 MG tablet   Oral   Take 1 tablet (500 mg total) by mouth 4 (four) times daily.   16 tablet   0   . Multiple Vitamin (MULTIVITAMIN) tablet   Oral   Take 1 tablet by mouth 1 day or 1 dose.         . ondansetron (ZOFRAN) 4 MG tablet   Oral   Take 1 tablet (4 mg total) by mouth every 8 (eight) hours as needed for nausea or vomiting.   10 tablet   1   . oxyCODONE-acetaminophen (ROXICET) 5-325 MG tablet   Oral   Take 1 tablet by mouth every 6 (six) hours as needed for severe pain.   20 tablet   0   . pantoprazole (PROTONIX) 40 MG tablet   Oral   Take 40 mg by mouth 1 day or 1 dose.         Marland Kitchen QUEtiapine (SEROQUEL) 400 MG tablet   Oral   Take 400 mg by mouth at bedtime.         . rivaroxaban (XARELTO) 20 MG TABS tablet   Oral   Take 20 mg by mouth at bedtime.         . sertraline (ZOLOFT) 100 MG tablet   Oral   Take 150 mg by mouth daily.         . traZODone (DESYREL) 150 MG tablet   Oral   Take 150 mg by mouth at bedtime.         . vitamin B-12 (CYANOCOBALAMIN) 1000 MCG tablet   Oral   Take 1 tablet by mouth 1 day or 1 dose.           Allergies Metformin and related and Morphine and related  Family History  Problem Relation Age of Onset  . Heart attack Mother   . Alzheimer's disease Father     Social History Social History  Substance Use Topics  . Smoking status: Current Every Day Smoker -- 0.50 packs/day    Types: Cigarettes  . Smokeless tobacco: Never Used  . Alcohol Use: No  Review of Systems  Constitutional: No  fever/chills Eyes: Positive for visual acuity changes 2 weeks.. Cardiovascular: no chest pain. Respiratory: no cough. No SOB. Gastrointestinal: No abdominal pain.  No nausea, no vomiting.  No diarrhea.  No constipation. Genitourinary: Negative for dysuria. No hematuria Musculoskeletal: Positive for severe lower back pain with numbness radiating into bilateral lower extremities Skin: Negative for rash, abrasions, lacerations, ecchymosis. Neurological: Positive for headache 2 weeks but denies focal weakness or numbness. 10-point ROS otherwise negative.  ____________________________________________   PHYSICAL EXAM:  VITAL SIGNS: ED Triage Vitals  Enc Vitals Group     BP 07/11/16 1557 99/51 mmHg     Pulse Rate 07/11/16 1557 96     Resp 07/11/16 1557 18     Temp 07/11/16 1557 97.6 F (36.4 C)     Temp Source 07/11/16 1557 Oral     SpO2 07/11/16 1557 96 %     Weight --      Height --      Head Cir --      Peak Flow --      Pain Score 07/11/16 1552 9     Pain Loc --      Pain Edu? --      Excl. in [place]? --      Constitutional: Alert and oriented. Well appearing and in no acute distress. Eyes: Conjunctivae are normal. PERRL. EOMI. Head: Atraumatic.No ecchymosis, contusion, abrasion, lacerations noted. Patient is nontender to palpation of the osseous structures of the skull. ENT:      Ears:       Nose: No congestion/rhinnorhea.      Mouth/Throat: Mucous membranes are moist.  Neck: No stridor.  No midline cervical spine tenderness to palpation. Negative supple with full range of motion  Cardiovascular: Normal rate, regular rhythm. Normal S1 and S2.  Good peripheral circulation. Respiratory: Normal respiratory effort without tachypnea or retractions. Lungs with wheezing to right upper and lower lips. Left side is clear to auscultation.Kermit Balo air entry to the bases with no decreased or absent breath sounds. Gastrointestinal: Bowel sounds 4 quadrants. Soft and nontender to  palpation. No guarding or rigidity. No palpable masses. No distention. No CVA tenderness. Musculoskeletal: Full range of motion to all extremities. No gross deformities appreciated. No visible deformity to spine but inspection. Patient is very tender to palpation in the lumbar spine. This is midline. No palpable abnormality. Positive straight leg raise bilaterally. Sensation intact and equal lower extremities. Dorsalis pedis pulse intact and equal lower extremities. Neurologic:  Normal speech and language. No gross focal neurologic deficits are appreciated. Radial nerves II through XII grossly intact. Negative Romberg's. Negative pronator drift. Skin:  Skin is warm, dry and intact. No rash noted. Psychiatric: Mood and affect are normal. Speech and behavior are normal. Patient exhibits appropriate insight and judgement.   ____________________________________________   LABS (all labs ordered are listed, but only abnormal results are displayed)  Labs Reviewed  COMPREHENSIVE METABOLIC PANEL - Abnormal; Notable for the following:    Sodium 131 (*)    Chloride 99 (*)    CO2 21 (*)    Glucose, Bld 328 (*)    BUN 35 (*)    Creatinine, Ser 1.98 (*)    GFR calc non Af Amer 33 (*)    GFR calc Af Amer 38 (*)    All other components within normal limits  CBC WITH DIFFERENTIAL/PLATELET - Abnormal; Notable for the following:    WBC 12.7 (*)    RDW 16.1 (*)  Platelets 142 (*)    Neutro Abs 9.9 (*)    All other components within normal limits  PROTIME-INR   ____________________________________________  EKG   ____________________________________________  RADIOLOGY Diamantina Providence Cuthriell, personally viewed and evaluated these images (plain radiographs) as part of my medical decision making, as well as reviewing the written report by the radiologist.  Dg Chest 2 View  07/11/2016  CLINICAL DATA:  Acute wheezing, shortness of breath, newly diagnosed right lower lobe of lung cancer. EXAM: CHEST   2 VIEW COMPARISON:  10/31/2015, 04/24/2016 FINDINGS: Normal heart size and vascularity. Chronic bronchitic changes and background interstitial opacities. No focal pneumonia, collapse or consolidation. No edema, effusion or pneumothorax. Trachea is midline. Right lower lobe lung cancer is mostly a ground-glass opacity by CT and is not clearly appreciated by plain radiography. IMPRESSION: Mild chronic bronchitic change and interstitial prominence diffusely. Right lower lobe ground-glass opacity compatible with known lung cancer is not visualized by plain radiography No superimposed acute process Electronically Signed   By: Jerilynn Mages.  Shick M.D.   On: 07/11/2016 16:52   Ct Head Wo Contrast  07/11/2016  CLINICAL DATA:  Back and left hip pain after falling approximately 1 week ago. Dizziness. History of lung cancer. EXAM: CT HEAD WITHOUT CONTRAST CT CERVICAL SPINE WITHOUT CONTRAST TECHNIQUE: Multidetector CT imaging of the head and cervical spine was performed following the standard protocol without intravenous contrast. Multiplanar CT image reconstructions of the cervical spine were also generated. COMPARISON:  Limited correlation made with PET CT 06/03/2016. FINDINGS: CT HEAD FINDINGS Brain: There is no evidence of acute intracranial hemorrhage, mass lesion, brain edema or extra-axial fluid collection. There is mild generalized atrophy. There is fairly symmetric encephalomalacia posteriorly in both occipital lobes. There is a probable prominent perivascular space in the left lentiform nucleus. There is no CT evidence of acute cortical infarction. Vascular: Intracranial vascular calcifications are noted. Skull: Negative for fracture or focal lesion. Sinuses/Orbits: Mild ethmoid sinus mucosal thickening, asymmetric to the right. The visualized paranasal sinuses and mastoid air cells are otherwise clear without air-fluid levels. Other: None. CT CERVICAL SPINE FINDINGS The cervical spine demonstrates straightening and mild  reversal of lordosis. There is no evidence of acute fracture or traumatic subluxation. Multilevel spondylosis is present with disc space loss, posterior osteophytes and uncinate spurring, most advanced from C4-5 through C6-7. Posterior osteophytes at those levels contribute to significant narrowing of the AP diameter of the spinal canal and probable cord deformity, grossly unchanged from recent PET-CT. There is also osseous foraminal narrowing which appears most severe on the right at C5-6. No acute soft tissue findings are evident. Carotid atherosclerosis and mild emphysematous changes at the lung apices are noted. TMJ degenerative changes are present bilaterally. IMPRESSION: 1. No evidence of acute intracranial hemorrhage, mass lesion or brain edema. 2. Bilateral occipital encephalomalacia. 3. No evidence of acute cervical spine fracture, traumatic subluxation or static signs of instability. 4. Advanced multilevel spondylosis contributing to probable cord flattening from C4-5 through C6-7. This spondylosis may certainly predispose to cord injury. If that is a clinical concern, consider further evaluation with cervical MRI. Electronically Signed   By: Richardean Sale M.D.   On: 07/11/2016 17:12   Ct Cervical Spine Wo Contrast  07/11/2016  CLINICAL DATA:  Back and left hip pain after falling approximately 1 week ago. Dizziness. History of lung cancer. EXAM: CT HEAD WITHOUT CONTRAST CT CERVICAL SPINE WITHOUT CONTRAST TECHNIQUE: Multidetector CT imaging of the head and cervical spine was performed following the standard  protocol without intravenous contrast. Multiplanar CT image reconstructions of the cervical spine were also generated. COMPARISON:  Limited correlation made with PET CT 06/03/2016. FINDINGS: CT HEAD FINDINGS Brain: There is no evidence of acute intracranial hemorrhage, mass lesion, brain edema or extra-axial fluid collection. There is mild generalized atrophy. There is fairly symmetric  encephalomalacia posteriorly in both occipital lobes. There is a probable prominent perivascular space in the left lentiform nucleus. There is no CT evidence of acute cortical infarction. Vascular: Intracranial vascular calcifications are noted. Skull: Negative for fracture or focal lesion. Sinuses/Orbits: Mild ethmoid sinus mucosal thickening, asymmetric to the right. The visualized paranasal sinuses and mastoid air cells are otherwise clear without air-fluid levels. Other: None. CT CERVICAL SPINE FINDINGS The cervical spine demonstrates straightening and mild reversal of lordosis. There is no evidence of acute fracture or traumatic subluxation. Multilevel spondylosis is present with disc space loss, posterior osteophytes and uncinate spurring, most advanced from C4-5 through C6-7. Posterior osteophytes at those levels contribute to significant narrowing of the AP diameter of the spinal canal and probable cord deformity, grossly unchanged from recent PET-CT. There is also osseous foraminal narrowing which appears most severe on the right at C5-6. No acute soft tissue findings are evident. Carotid atherosclerosis and mild emphysematous changes at the lung apices are noted. TMJ degenerative changes are present bilaterally. IMPRESSION: 1. No evidence of acute intracranial hemorrhage, mass lesion or brain edema. 2. Bilateral occipital encephalomalacia. 3. No evidence of acute cervical spine fracture, traumatic subluxation or static signs of instability. 4. Advanced multilevel spondylosis contributing to probable cord flattening from C4-5 through C6-7. This spondylosis may certainly predispose to cord injury. If that is a clinical concern, consider further evaluation with cervical MRI. Electronically Signed   By: Richardean Sale M.D.   On: 07/11/2016 17:12   Ct Lumbar Spine Wo Contrast  07/11/2016  CLINICAL DATA:  Fall about a week and a half ago. Back pain and left hip pain. History of fracture with surgery. History  lung cancer. EXAM: CT LUMBAR SPINE WITHOUT CONTRAST TECHNIQUE: Multidetector CT imaging of the lumbar spine was performed without intravenous contrast administration. Multiplanar CT image reconstructions were also generated. COMPARISON:  Chest CT of 04/10/2016. Abdominal pelvic CT of 10/31/2015. FINDINGS: Soft tissues: Abdominal aortic atherosclerosis. IVC filter. 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 This was delineated on prior CT with contrast. Retroaortic left renal vein. No significant free fluid. Bones: Status post trans pedicle screw fixation at L5-S1. No hardware complication identified. A superior endplate compression deformity at T12 is similar, resulting in approximately 25% vertebral body height loss anteriorly. No ventral canal encroachment. Underlying sclerosis is similar and favored to be due to healing. No suspicious focal osseous lesions. Broad-based disc bulges at L2-3, L3-4. Moderate disc bulge at L4-5. At L4-5, suspicion of a right-sided lateral recess disc bulge or fragment including on image 8/ series 607. This area is artifact degraded. Underlying disc desiccation at this level. IMPRESSION: 1. Status post L5-S1 fixation, without acute hardware complication identified. 2. Similar mild T12 superior endplate compression deformity, without ventral canal encroachment. 3. Suspicion of an eccentric right disc bulge or disc fragment at L4-5. This area would be better evaluated with pre and post contrast lumbar spine MRI. Electronically Signed   By: Abigail Miyamoto M.D.   On: 07/11/2016 17:17    ____________________________________________    PROCEDURES  Procedure(s) performed:       Medications  HYDROmorphone (DILAUDID) injection 1 mg (1 mg Intravenous Given 07/11/16 1708)  ondansetron [facility]) injection 4 mg (4 mg Intravenous Given 07/11/16 1708)  HYDROmorphone (DILAUDID) injection 1 mg (1 mg Intravenous Given 07/11/16 1839)      ____________________________________________   INITIAL IMPRESSION / ASSESSMENT AND PLAN / ED COURSE  Pertinent labs & imaging results that were available during my care of the patient were reviewed by me and considered in my medical decision making (see chart for details).  Patient's diagnosis is consistent with Bulging disks in the lumbar region. Patient's exam is mostly reassuring. CT reveals bulging disc. Patient is referred to orthopedics for follow-up and possible MRI. No indication for emergent MRI at this time. I discussed the patient's case with attending physician Dr. Jimmye Norman prior to discharge.. Patient will be discharged home with prescriptions for pain medication and muscle relaxers. Patient is to follow up with orthopedics for further assessment and treatment. Patient is given ED precautions to return to the ED for any worsening or new symptoms.     ____________________________________________  FINAL CLINICAL IMPRESSION(S) / ED DIAGNOSES  Final diagnoses:  Fall, initial encounter  Bulging lumbar disc  Acute post-traumatic headache, not intractable      NEW MEDICATIONS STARTED DURING THIS VISIT:  New Prescriptions   METHOCARBAMOL (ROBAXIN) 500 MG TABLET    Take 1 tablet (500 mg total) by mouth 4 (four) times daily.   OXYCODONE-ACETAMINOPHEN (ROXICET) 5-325 MG TABLET    Take 1 tablet by mouth every 6 (six) hours as needed for severe pain.        This chart was dictated using voice recognition software/Dragon. Despite best efforts to proofread, errors can occur which can change the meaning. Any change was purely unintentional.    Darletta Moll, PA-C 07/11/16 1840  Earleen Newport, MD 07/11/16 [phone]

## 2016-07-21 ENCOUNTER — Emergency Department
Admission: EM | Admit: 2016-07-21 | Discharge: 2016-07-21 | Disposition: A | Discharge status: Home / Self Care | Payer: Medicare Other | Attending: Emergency Medicine | Admitting: Emergency Medicine

## 2016-07-21 DIAGNOSIS — Z7982 Long term (current) use of aspirin: Secondary | ICD-10-CM | POA: Insufficient documentation

## 2016-07-21 DIAGNOSIS — I509 Heart failure, unspecified: Secondary | ICD-10-CM | POA: Diagnosis not present

## 2016-07-21 DIAGNOSIS — M545 Low back pain: Secondary | ICD-10-CM | POA: Diagnosis present

## 2016-07-21 DIAGNOSIS — F17210 Nicotine dependence, cigarettes, uncomplicated: Secondary | ICD-10-CM | POA: Diagnosis not present

## 2016-07-21 DIAGNOSIS — M5440 Lumbago with sciatica, unspecified side: Secondary | ICD-10-CM | POA: Insufficient documentation

## 2016-07-21 DIAGNOSIS — I110 Hypertensive heart disease with heart failure: Secondary | ICD-10-CM | POA: Diagnosis not present

## 2016-07-21 DIAGNOSIS — M5430 Sciatica, unspecified side: Secondary | ICD-10-CM

## 2016-07-21 DIAGNOSIS — I2510 Atherosclerotic heart disease of native coronary artery without angina pectoris: Secondary | ICD-10-CM | POA: Insufficient documentation

## 2016-07-21 DIAGNOSIS — J449 Chronic obstructive pulmonary disease, unspecified: Secondary | ICD-10-CM | POA: Diagnosis not present

## 2016-07-21 DIAGNOSIS — Z85118 Personal history of other malignant neoplasm of bronchus and lung: Secondary | ICD-10-CM | POA: Diagnosis not present

## 2016-07-21 DIAGNOSIS — E119 Type 2 diabetes mellitus without complications: Secondary | ICD-10-CM | POA: Diagnosis not present

## 2016-07-21 MED ORDER — OXYCODONE-ACETAMINOPHEN 5-325 MG PO TABS: 1.0000 | ORAL_TABLET | Freq: Four times a day (QID) | ORAL | 0 refills | Status: DC | PRN

## 2016-07-21 NOTE — ED Triage Notes (Signed)
Pt states back pain that began 3 weeks ago, states he fell and hit it today. States he has had 3 back surgeries. Pt states he is from a group home, but doesn't remember name. Pt states he saw a doctor 3 weeks ago that referred him to orthopedic but states he can't go to that one.

## 2016-07-21 NOTE — ED Notes (Signed)
Pt agitated upon D/C. Pt waiting in lobby for ride, states he thinks his group home is here to pick him up.

## 2016-07-21 NOTE — ED Provider Notes (Signed)
[facility] Emergency Department Provider Note   ____________________________________________   None    (approximate)  I have reviewed the triage vital signs and the nursing notes.   HISTORY  Chief Complaint Back Pain    HPI Fernando Marshall is a 70 y.o. male patient complaining of low back pain secondary to a fall. Patient was seen 3 weeks ago for the same complaint and had imaging done with findings consistent of right lumbar disc bulge at L4-L5. On patient last visit he was referred to his previous orthopedic but states unable to see them secondary to a bill from 2001. Patient states radicular component to bilateral leg today. Patient denies any bladder or bowel dysfunction. No palliative measures for this complaint. He is rating his pain as a 10 over 10. Patient described the pain as sharp. Past Medical History:  Diagnosis Date  . Anxiety   . Asthma   . 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 CHF (congestive heart failure) ([place])   . Collagen vascular disease ([place])   . COPD (chronic obstructive pulmonary disease) ([place])   . Coronary artery disease   . Depression   . Diabetes mellitus without complication ([place])   . Hypertension   . Lung cancer ([place])   . Lung mass   . PE (physical exam), annual   . Renal insufficiency     Patient Active Problem List   Diagnosis Date Noted  . Lung cancer, lower lobe ([place]) 06/06/2016  . Bipolar affective disorder ([place]) 04/28/2016  . Bipolar affective disorder, current episode depressed ([place]) 04/28/2016  . Arteriosclerosis of coronary artery 04/28/2016  . Congestive heart failure ([place]) 04/28/2016  . Chronic pain 04/28/2016  . Chronic obstructive pulmonary disease ([place]) 04/28/2016  . Diabetes mellitus ([place]) 04/28/2016  . Dyslipidemia 04/28/2016  . Cerebrovascular accident, old 04/28/2016  . History of drug dependence ([place]) 04/28/2016  . BP (high blood pressure) 04/28/2016  . Paroxysmal atrial fibrillation ([place]) 04/28/2016  .  Type 2 diabetes mellitus ([place]) 04/28/2016  . Anxiety 07/09/2015  . Clinical depression 07/09/2015  . Chest pain 07/24/2014  . 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 Cocaine abuse in remission 02/20/2011  . HLD (hyperlipidemia) 02/20/2011  . Opioid dependence ([place]) 02/11/2011    Past Surgical History:  Procedure Laterality Date  . ANKLE RECONSTRUCTION Left   . BACK SURGERY    . CHOLECYSTECTOMY    . IVC FILTER PLACEMENT ([facility] HX)    . KIDNEY STONE SURGERY    . LUNG BIOPSY  04/24/16    Prior to Admission medications   Medication Sig Start Date End Date Taking? Authorizing Provider  albuterol (PROVENTIL HFA;VENTOLIN HFA) 108 (90 BASE) MCG/ACT inhaler Inhale 2 puffs into the lungs every 6 (six) hours as needed for wheezing or shortness of breath. 10/31/15   Drenda Freeze, MD  aspirin EC 81 MG tablet Take 81 mg by mouth.    Historical Provider, MD  atorvastatin (LIPITOR) 40 MG tablet Take 40 mg by mouth 1 day or 1 dose. 07/26/14   Historical Provider, MD  Cholecalciferol (VITAMIN D-1000 MAX ST) 1000 units tablet Take 1 tablet by mouth 1 day or 1 dose.    Historical Provider, MD  diazepam (VALIUM) 5 MG tablet Take 1 tablet 1 hour prior to PET scan and 1 tablet 15 mins prior to PET scan as needed 06/02/16   Cammie Sickle, MD  gabapentin (NEURONTIN) 300 MG capsule Take 900 mg by mouth 1 day or  1 dose. 02/08/16   Historical Provider, MD  glipiZIDE (GLUCOTROL) 5 MG tablet Take 1 tablet (5 mg total) by mouth daily. 10/31/15 10/30/16  Drenda Freeze, MD  hydrOXYzine (ATARAX/VISTARIL) 50 MG tablet Take 50 mg by mouth every 8 (eight) hours as needed.    Historical Provider, MD  lamoTRIgine (LAMICTAL) 25 MG tablet Take 50 mg by mouth 2 (two) times daily.    Historical Provider, MD  lisinopril-hydrochlorothiazide (ZESTORETIC) 10-12.5 MG tablet Take 1 tablet by mouth daily. 10/31/15   Drenda Freeze, MD  methocarbamol (ROBAXIN) 500 MG tablet Take 1 tablet (500 mg total) by mouth 4  (four) times daily. 07/11/16   Charline Bills Cuthriell, PA-C  Multiple Vitamin (MULTIVITAMIN) tablet Take 1 tablet by mouth 1 day or 1 dose.    Historical Provider, MD  ondansetron (ZOFRAN) 4 MG tablet Take 1 tablet (4 mg total) by mouth every 8 (eight) hours as needed for nausea or vomiting. 10/31/15 10/30/16  Drenda Freeze, MD  oxyCODONE-acetaminophen (ROXICET) 5-325 MG tablet Take 1 tablet by mouth every 6 (six) hours as needed for severe pain. 07/11/16   Charline Bills Cuthriell, PA-C  oxyCODONE-acetaminophen (ROXICET) 5-325 MG tablet Take 1 tablet by mouth every 6 (six) hours as needed for moderate pain. 07/21/16   Sable Feil, PA-C  pantoprazole (PROTONIX) 40 MG tablet Take 40 mg by mouth 1 day or 1 dose.    Historical Provider, MD  QUEtiapine (SEROQUEL) 400 MG tablet Take 400 mg by mouth at bedtime.    Historical Provider, MD  rivaroxaban (XARELTO) 20 MG TABS tablet Take 20 mg by mouth at bedtime.    Historical Provider, MD  sertraline (ZOLOFT) 100 MG tablet Take 150 mg by mouth daily.    Historical Provider, MD  traZODone (DESYREL) 150 MG tablet Take 150 mg by mouth at bedtime.    Historical Provider, MD  vitamin B-12 (CYANOCOBALAMIN) 1000 MCG tablet Take 1 tablet by mouth 1 day or 1 dose.    Historical Provider, MD    Allergies Metformin and related and Morphine and related  Family History  Problem Relation Age of Onset  . Heart attack Mother   . Alzheimer's disease Father     Social History Social History  Substance Use Topics  . Smoking status: Current Every Day Smoker    Packs/day: 0.50    Types: Cigarettes  . Smokeless tobacco: Never Used  . Alcohol use No    Review of Systems Constitutional: No fever/chills Eyes: No visual changes. ENT: No sore throat. Cardiovascular: Denies chest pain. Respiratory: Denies shortness of breath. Gastrointestinal: No abdominal pain.  No nausea, no vomiting.  No diarrhea.  No constipation. Genitourinary: Negative for  dysuria. Musculoskeletal: Positive for back pain. Skin: Negative for rash. Neurological: Negative for headaches, focal weakness or numbness. Psychiatric:Bipolar and anxiety. Endocrine:Diabetes, hyperlipidemia, and hypertension. Hematological/Lymphatic: Allergic/Immunilogical: Metformin and morphine. 10-point ROS otherwise negative.  ____________________________________________   PHYSICAL EXAM:  VITAL SIGNS: ED Triage Vitals  Enc Vitals Group     BP 07/21/16 1818 (!) 187/89     Pulse Rate 07/21/16 1818 90     Resp 07/21/16 1818 18     Temp 07/21/16 1818 98.7 F (37.1 C)     Temp src --      SpO2 07/21/16 1818 96 %     Weight 07/21/16 1818 203 lb (92.1 kg)     Height 07/21/16 1818 '5\' 7"'$  (1.702 m)     Head Circumference --  Peak Flow --      Pain Score 07/21/16 1914 10     Pain Loc --      Pain Edu? --      Excl. in [place]? --     Constitutional: Alert and oriented. Well appearing and in no acute distress. Eyes: Conjunctivae are normal. PERRL. EOMI. Head: Atraumatic. Nose: No congestion/rhinnorhea. Mouth/Throat: Mucous membranes are moist.  Oropharynx non-erythematous. Neck: No stridor.  No cervical spine tenderness to palpation. Hematological/Lymphatic/Immunilogical: No cervical lymphadenopathy. Cardiovascular: Normal rate, regular rhythm. Grossly normal heart sounds.  Good peripheral circulation. Respiratory: Normal respiratory effort.  No retractions. Lungs CTAB. Gastrointestinal: Soft and nontender. No distention. No abdominal bruits. No CVA tenderness. Musculoskeletal: No obvious deformity to the L-spine. Patient's noticed specific stamina reliance on upper extremity. There is surgical scars consistent with his history. Patient has some moderate guarding palpation L3-L5. Patient has straight leg test bilaterally naproxen 70.  Neurologic:  Normal speech and language. No gross focal neurologic deficits are appreciated. No gait instability. Skin:  Skin is warm, dry  and intact. No rash noted. Psychiatric: Mood and affect are normal. Speech and behavior are normal.  ____________________________________________   LABS (all labs ordered are listed, but only abnormal results are displayed)  Labs Reviewed - No data to display ____________________________________________  EKG   ____________________________________________  RADIOLOGY  Reviewed CT scan of the lumbar spine from previous exam 3 weeks ago. ____________________________________________   PROCEDURES  Procedure(s) performed: None  Procedures  Critical Care performed: No  ____________________________________________   INITIAL IMPRESSION / ASSESSMENT AND PLAN / ED COURSE  Pertinent labs & imaging results that were available during my care of the patient were reviewed by me and considered in my medical decision making (see chart for details).  Radicular back pain with disc bulge at L4-5. Patient given discharge care instruction and advised follow-up with the orthopedic Department.  Clinical Course   Patient given Dilaudid 1 mg IM.  ____________________________________________   FINAL CLINICAL IMPRESSION(S) / ED DIAGNOSES  Final diagnoses:  Sciatica, unspecified laterality      NEW MEDICATIONS STARTED DURING THIS VISIT:  New Prescriptions   OXYCODONE-ACETAMINOPHEN (ROXICET) 5-325 MG TABLET    Take 1 tablet by mouth every 6 (six) hours as needed for moderate pain.     Note:  This document was prepared using Dragon voice recognition software and may include unintentional dictation errors.    Sable Feil, PA-C 07/21/16 2003    Hinda Kehr, MD 07/21/16 2006

## 2016-08-01 ENCOUNTER — Emergency Department
Admission: EM | Admit: 2016-08-01 | Discharge: 2016-08-01 | Disposition: A | Discharge status: Home / Self Care | Payer: Medicare Other | Attending: Emergency Medicine | Admitting: Emergency Medicine

## 2016-08-01 ENCOUNTER — Encounter: Payer: Self-pay | Admitting: Emergency Medicine

## 2016-08-01 DIAGNOSIS — M5432 Sciatica, left side: Secondary | ICD-10-CM

## 2016-08-01 DIAGNOSIS — I2510 Atherosclerotic heart disease of native coronary artery without angina pectoris: Secondary | ICD-10-CM | POA: Diagnosis not present

## 2016-08-01 DIAGNOSIS — G8929 Other chronic pain: Secondary | ICD-10-CM | POA: Insufficient documentation

## 2016-08-01 DIAGNOSIS — M5442 Lumbago with sciatica, left side: Secondary | ICD-10-CM | POA: Diagnosis not present

## 2016-08-01 DIAGNOSIS — M545 Low back pain: Secondary | ICD-10-CM | POA: Diagnosis present

## 2016-08-01 DIAGNOSIS — Z7984 Long term (current) use of oral hypoglycemic drugs: Secondary | ICD-10-CM | POA: Insufficient documentation

## 2016-08-01 DIAGNOSIS — Z7982 Long term (current) use of aspirin: Secondary | ICD-10-CM | POA: Insufficient documentation

## 2016-08-01 DIAGNOSIS — I110 Hypertensive heart disease with heart failure: Secondary | ICD-10-CM | POA: Diagnosis not present

## 2016-08-01 DIAGNOSIS — J449 Chronic obstructive pulmonary disease, unspecified: Secondary | ICD-10-CM | POA: Diagnosis not present

## 2016-08-01 DIAGNOSIS — J45909 Unspecified asthma, uncomplicated: Secondary | ICD-10-CM | POA: Diagnosis not present

## 2016-08-01 DIAGNOSIS — I509 Heart failure, unspecified: Secondary | ICD-10-CM | POA: Diagnosis not present

## 2016-08-01 DIAGNOSIS — Z85110 Personal history of malignant carcinoid tumor of bronchus and lung: Secondary | ICD-10-CM | POA: Insufficient documentation

## 2016-08-01 DIAGNOSIS — M549 Dorsalgia, unspecified: Secondary | ICD-10-CM

## 2016-08-01 DIAGNOSIS — F17210 Nicotine dependence, cigarettes, uncomplicated: Secondary | ICD-10-CM | POA: Diagnosis not present

## 2016-08-01 DIAGNOSIS — E119 Type 2 diabetes mellitus without complications: Secondary | ICD-10-CM | POA: Diagnosis not present

## 2016-08-01 LAB — URINALYSIS COMPLETE WITH MICROSCOPIC (ARMC ONLY)
BACTERIA UA: NONE SEEN
Bilirubin Urine: NEGATIVE
Glucose, UA: >500 mg/dL — AB
Hgb urine dipstick: NEGATIVE
Ketones, ur: NEGATIVE mg/dL
LEUKOCYTES UA: NEGATIVE
Nitrite: NEGATIVE
PH: 6 (ref 5.0–8.0)
PROTEIN: NEGATIVE mg/dL
RBC / HPF: NONE SEEN RBC/hpf (ref 0–5)
SQUAMOUS EPITHELIAL / LPF: NONE SEEN
Specific Gravity, Urine: 1.005 (ref 1.005–1.030)

## 2016-08-01 LAB — GLUCOSE, CAPILLARY: GLUCOSE-CAPILLARY: 229 mg/dL — AB (ref 65–99)

## 2016-08-01 MED ORDER — ONDANSETRON 4 MG PO TBDP
4.0000 mg | ORAL_TABLET | Freq: Once | ORAL | Status: AC
  Administered 2016-08-01: 4 mg via ORAL
  Filled 2016-08-01: qty 1

## 2016-08-01 MED ORDER — HYDROMORPHONE HCL 1 MG/ML IJ SOLN
1.0000 mg | Freq: Once | INTRAMUSCULAR | Status: AC
  Administered 2016-08-01: 1 mg via INTRAMUSCULAR
  Filled 2016-08-01: qty 1

## 2016-08-01 MED ORDER — OXYCODONE-ACETAMINOPHEN 5-325 MG PO TABS: 1.0000 | ORAL_TABLET | Freq: Four times a day (QID) | ORAL | 0 refills | Status: DC | PRN

## 2016-08-01 MED ORDER — OXYCODONE-ACETAMINOPHEN 5-325 MG PO TABS: 1.0000 | ORAL_TABLET | Freq: Four times a day (QID) | ORAL | 0 refills | Status: AC | PRN

## 2016-08-01 MED ORDER — OXYCODONE-ACETAMINOPHEN 5-325 MG PO TABS
1.0000 | ORAL_TABLET | Freq: Four times a day (QID) | ORAL | 0 refills | Status: AC | PRN
Start: 2016-08-01 — End: ?

## 2016-08-01 NOTE — ED Triage Notes (Signed)
Patient presents to the ED with severe chronic back pain.  Patient was recently seen by an orthopedic doctor and patient states he was told by the orthopedist to come to the ED to be seen.  Patient was brought to the ED by Milo Staff.  Patient states he currently has lung cancer and is being seen by oncology for that but that his back pain seems to be getting worse.

## 2016-08-01 NOTE — Discharge Instructions (Addendum)
Follow-up with your primary care doctor for any continued pain medication and also call your orthopedist. Continue your diabetes medicine and also watch your diabetic diet as your  blood sugar in the emergency room was elevated.

## 2016-08-01 NOTE — ED Triage Notes (Signed)
Pt has bulging disc in back and reports "i just need a shot and something to ease this pain off, I have 6 bulging discs " . Reports sx for 2 months. Denies loss of bowel or bladder control.

## 2016-08-01 NOTE — ED Provider Notes (Signed)
[facility] Emergency Department Provider Note  ____________________________________________   First MD Initiated Contact with Patient 08/01/16 1405     (approximate)  I have reviewed the triage vital signs and the nursing notes.   HISTORY  Chief Complaint Back Pain    HPI Fernando Marshall is a 70 y.o. male is here complaining of low back pain.Patient states that he was seen in the orthopedic department 2 days ago at which time he was told that with his chronic back pain that they would make arrangements for him to see someone in the pain clinic. Patient states that he has had lumbar injections in the past and they only last for a very brief period time. Patient states that today his low back pain is radiating down his left leg. Patient currently lives at a group home and was brought to the emergency room by a staff member. Patient reports that he has "6 bulging disc". He denies any incontinence of bowel or bladder. He denies any other paresthesias into his lower extremities other than the radicular pain that he is experiencing in his left leg. Patient is also seeing the oncology department because of "lung cancer" and will be going through a procedure in the next couple weeks for that. He has not feel that he will be seen by anyone in the pain management clinic for at least 4 weeks. Currently his low back pain is in the same area as he is experienced in the past. He rates his pain as a 10 over 10.   Past Medical History:  Diagnosis Date  . Anxiety   . Asthma   . 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 CHF (congestive heart failure) ([place])   . Collagen vascular disease ([place])   . COPD (chronic obstructive pulmonary disease) ([place])   . Coronary artery disease   . Depression   . Diabetes mellitus without complication ([place])   . Hypertension   . Lung cancer ([place])   . Lung mass   . PE (physical exam), annual   . Renal insufficiency     Patient Active Problem List   Diagnosis Date Noted  . Lung cancer, lower lobe ([place]) 06/06/2016  . Bipolar affective disorder ([place]) 04/28/2016  . Bipolar affective disorder, current episode depressed ([place]) 04/28/2016  . Arteriosclerosis of coronary artery 04/28/2016  . Congestive heart failure ([place]) 04/28/2016  . Chronic pain 04/28/2016  . Chronic obstructive pulmonary disease ([place]) 04/28/2016  . Diabetes mellitus ([place]) 04/28/2016  . Dyslipidemia 04/28/2016  . Cerebrovascular accident, old 04/28/2016  . History of drug dependence ([place]) 04/28/2016  . BP (high blood pressure) 04/28/2016  . Paroxysmal atrial fibrillation ([place]) 04/28/2016  . Type 2 diabetes mellitus ([place]) 04/28/2016  . Anxiety 07/09/2015  . Clinical depression 07/09/2015  . Chest pain 07/24/2014  . 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 Cocaine abuse in remission 02/20/2011  . HLD (hyperlipidemia) 02/20/2011  . Opioid dependence ([place]) 02/11/2011    Past Surgical History:  Procedure Laterality Date  . ANKLE RECONSTRUCTION Left   . BACK SURGERY    . CHOLECYSTECTOMY    . IVC FILTER PLACEMENT ([facility] HX)    . KIDNEY STONE SURGERY    . LUNG BIOPSY  04/24/16    Prior to Admission medications   Medication Sig Start Date End Date Taking? Authorizing Provider  albuterol (PROVENTIL HFA;VENTOLIN HFA) 108 (90 BASE) MCG/ACT inhaler Inhale 2 puffs into the lungs every 6 (six) hours as needed for wheezing  or shortness of breath. 10/31/15   Drenda Freeze, MD  aspirin EC 81 MG tablet Take 81 mg by mouth.    Historical Provider, MD  atorvastatin (LIPITOR) 40 MG tablet Take 40 mg by mouth 1 day or 1 dose. 07/26/14   Historical Provider, MD  Cholecalciferol (VITAMIN D-1000 MAX ST) 1000 units tablet Take 1 tablet by mouth 1 day or 1 dose.    Historical Provider, MD  diazepam (VALIUM) 5 MG tablet Take 1 tablet 1 hour prior to PET scan and 1 tablet 15 mins prior to PET scan as needed 06/02/16   Cammie Sickle, MD  gabapentin (NEURONTIN) 300 MG capsule  Take 900 mg by mouth 1 day or 1 dose. 02/08/16   Historical Provider, MD  glipiZIDE (GLUCOTROL) 5 MG tablet Take 1 tablet (5 mg total) by mouth daily. 10/31/15 10/30/16  Drenda Freeze, MD  hydrOXYzine (ATARAX/VISTARIL) 50 MG tablet Take 50 mg by mouth every 8 (eight) hours as needed.    Historical Provider, MD  lamoTRIgine (LAMICTAL) 25 MG tablet Take 50 mg by mouth 2 (two) times daily.    Historical Provider, MD  lisinopril-hydrochlorothiazide (ZESTORETIC) 10-12.5 MG tablet Take 1 tablet by mouth daily. 10/31/15   Drenda Freeze, MD  methocarbamol (ROBAXIN) 500 MG tablet Take 1 tablet (500 mg total) by mouth 4 (four) times daily. 07/11/16   Charline Bills Cuthriell, PA-C  Multiple Vitamin (MULTIVITAMIN) tablet Take 1 tablet by mouth 1 day or 1 dose.    Historical Provider, MD  ondansetron (ZOFRAN) 4 MG tablet Take 1 tablet (4 mg total) by mouth every 8 (eight) hours as needed for nausea or vomiting. 10/31/15 10/30/16  Drenda Freeze, MD  oxyCODONE-acetaminophen (PERCOCET) 5-325 MG tablet Take 1 tablet by mouth every 6 (six) hours as needed for severe pain. 08/01/16   Johnn Hai, PA-C  oxyCODONE-acetaminophen (PERCOCET) 5-325 MG tablet Take 1 tablet by mouth every 6 (six) hours as needed for severe pain. 08/01/16   Johnn Hai, PA-C  pantoprazole (PROTONIX) 40 MG tablet Take 40 mg by mouth 1 day or 1 dose.    Historical Provider, MD  QUEtiapine (SEROQUEL) 400 MG tablet Take 400 mg by mouth at bedtime.    Historical Provider, MD  rivaroxaban (XARELTO) 20 MG TABS tablet Take 20 mg by mouth at bedtime.    Historical Provider, MD  sertraline (ZOLOFT) 100 MG tablet Take 150 mg by mouth daily.    Historical Provider, MD  traZODone (DESYREL) 150 MG tablet Take 150 mg by mouth at bedtime.    Historical Provider, MD  vitamin B-12 (CYANOCOBALAMIN) 1000 MCG tablet Take 1 tablet by mouth 1 day or 1 dose.    Historical Provider, MD    Allergies Metformin and related and Morphine and related  Family  History  Problem Relation Age of Onset  . Heart attack Mother   . Alzheimer's disease Father     Social History Social History  Substance Use Topics  . Smoking status: Current Every Day Smoker    Packs/day: 0.50    Types: Cigarettes  . Smokeless tobacco: Never Used  . Alcohol use No    Review of Systems Constitutional: No fever/chills Eyes: No visual changes. ENT: No sore throat. Cardiovascular: Denies chest pain. Respiratory: Denies shortness of breath. Gastrointestinal: No abdominal pain.  No nausea, no vomiting.  No diarrhea.  No constipation. Genitourinary: Negative for dysuria.Positive history for kidney stones. Musculoskeletal: Positive low back pain both acute and chronic. Skin:  Negative for rash. Neurological: Negative for headaches, focal weakness. Positive left leg radicular pain.  10-point ROS otherwise negative.  ____________________________________________   PHYSICAL EXAM:  VITAL SIGNS: ED Triage Vitals [08/01/16 1325]  Enc Vitals Group     BP (!) 166/86     Pulse Rate 96     Resp 20     Temp 98.9 F (37.2 C)     Temp Source Oral     SpO2 97 %     Weight 210 lb (95.3 kg)     Height '5\' 6"'$  (1.676 m)     Head Circumference      Peak Flow      Pain Score 10     Pain Loc      Pain Edu?      Excl. in [place]?     Constitutional: Alert and oriented. Well appearing and in no acute distress. Eyes: Conjunctivae are normal. PERRL. EOMI. Head: Atraumatic. Nose: No congestion/rhinnorhea. Mouth/Throat: Mucous membranes are moist.  Oropharynx non-erythematous. Neck: No stridor.   Cardiovascular: Normal rate, regular rhythm. Grossly normal heart sounds.  Good peripheral circulation. Respiratory: Normal respiratory effort.  No retractions. Lungs CTAB. Gastrointestinal: Soft and nontender. No distention. No CVA tenderness.Bowel sounds normal active 4 quadrants. Musculoskeletal: Examination of back there is no gross deformity however there is surgical scars  consistent with his history. There is moderate tenderness on palpation of the lumbar spine especially around L5-S1 and paravertebral muscles bilaterally. There is no active muscle spasm seen however patient does have difficulty getting from supine to sitting position. Patient is able to move lower extremities without any assistance. Muscle strength is equal bilaterally. Reflexes are 1+ bilaterally. Neurologic:  Normal speech and language. No gross focal neurologic deficits are appreciated. No gait instability. Skin:  Skin is warm, dry and intact. No rash noted. No ecchymosis or abrasions were noted. Psychiatric: Mood and affect are normal. Speech and behavior are normal.  ____________________________________________   LABS (all labs ordered are listed, but only abnormal results are displayed)  Labs Reviewed  URINALYSIS COMPLETEWITH MICROSCOPIC ([place]) - Abnormal; Notable for the following:       Result Value   Color, Urine STRAW (*)    APPearance CLEAR (*)    Glucose, UA >500 (*)    All other components within normal limits  GLUCOSE, CAPILLARY - Abnormal; Notable for the following:    Glucose-Capillary 229 (*)    All other components within normal limits  CBG MONITORING, ED    PROCEDURES  Procedure(s) performed: None  Procedures  Critical Care performed: No  ____________________________________________   INITIAL IMPRESSION / ASSESSMENT AND PLAN / ED COURSE  Pertinent labs & imaging results that were available during my care of the patient were reviewed by me and considered in my medical decision making (see chart for details).    Clinical Course  Patient is to follow-up with Dr. Gwynneth Aliment who is his primary care doctor about his diabetes and also about pain control. He is also to contact the orthopedic department to follow-up on referral to pain clinic. Patient was given a one-time shot of Dilaudid 1 mg IM in the emergency room along with a prescription for Percocet 5 mg #4  tablets one every 6 hours when necessary severe pain. Patient is being picked up by a staff member from the group home.   ____________________________________________   FINAL CLINICAL IMPRESSION(S) / ED DIAGNOSES  Final diagnoses:  Chronic back pain  Sciatica of left side  NEW MEDICATIONS STARTED DURING THIS VISIT:  Current Discharge Medication List       Note:  This document was prepared using Dragon voice recognition software and may include unintentional dictation errors.    Johnn Hai, PA-C 08/01/16 1607    Delman Kitten, MD 08/01/16 [phone]

## 2016-08-08 ENCOUNTER — Inpatient Hospital Stay: Payer: Medicare Other | Admitting: Internal Medicine

## 2016-08-12 ENCOUNTER — Ambulatory Visit: Payer: Medicare Other

## 2016-08-18 ENCOUNTER — Inpatient Hospital Stay: Payer: Medicare Other | Admitting: Internal Medicine

## 2016-09-04 ENCOUNTER — Inpatient Hospital Stay: Payer: Medicare Other | Admitting: Internal Medicine

## 2017-08-02 IMAGING — CT CT CHEST W/O CM
1 series · 13 of 34 positions shown, 17 images · non-contrast
Comparison: Chest CT 01/07/2016.

CLINICAL DATA: 69-year-old male with mid chest pain for the past
several months. History of congestive heart failure, asthma and
COPD.

EXAM:
CT CHEST WITHOUT CONTRAST
TECHNIQUE: Multidetector CT imaging of the chest was performed following the
standard protocol without IV contrast.

[Series 2: routine chest wo · axial · 0.71mm/px · z∈[-665,-389]mm · 13 of 164 slices shown, 17 images]
[im 13/164  mediastinal]
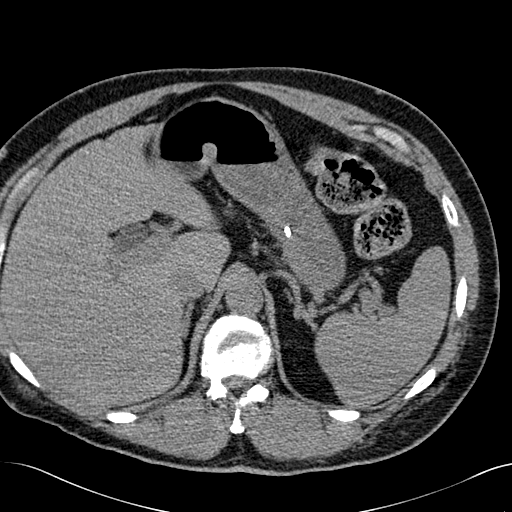
[im 13/164  lung]
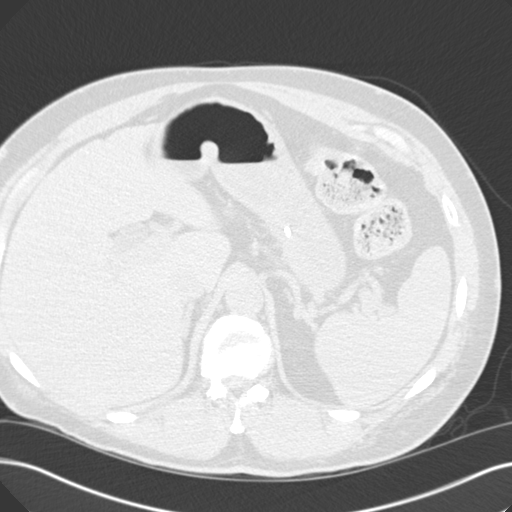
[im 25/164  lung]
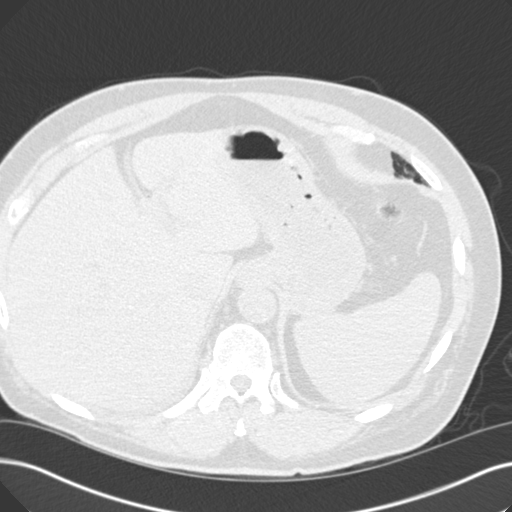
[im 37/164  lung]
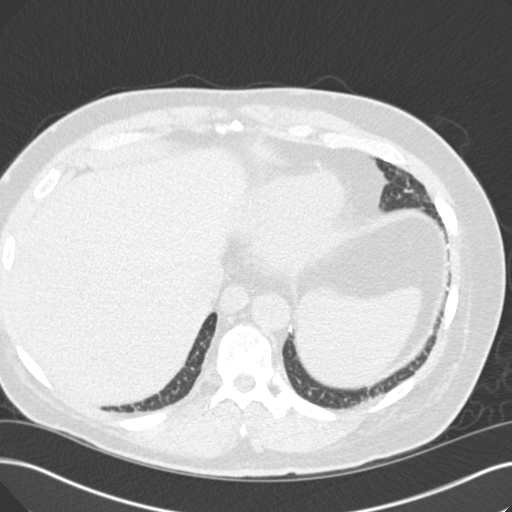
[im 49/164  lung]
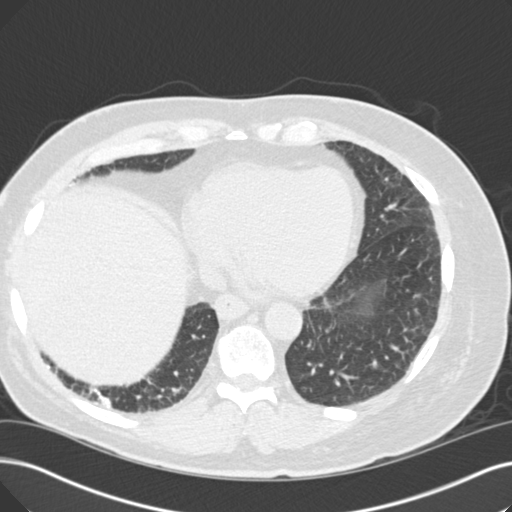
[im 66/164  mediastinal]
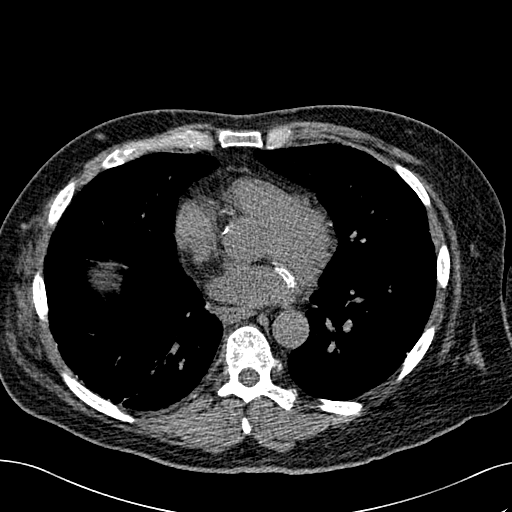
[im 66/164  lung]
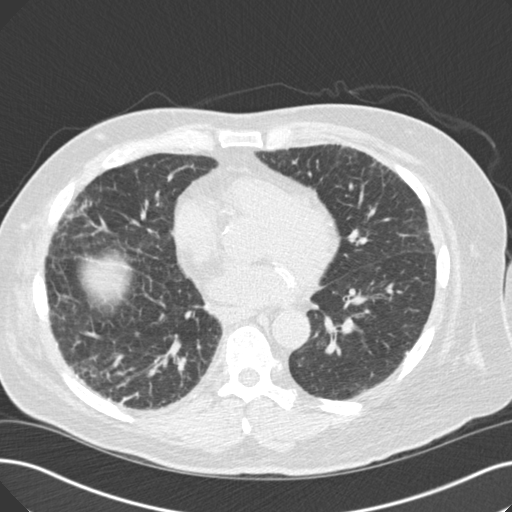
[im 73/164  lung]
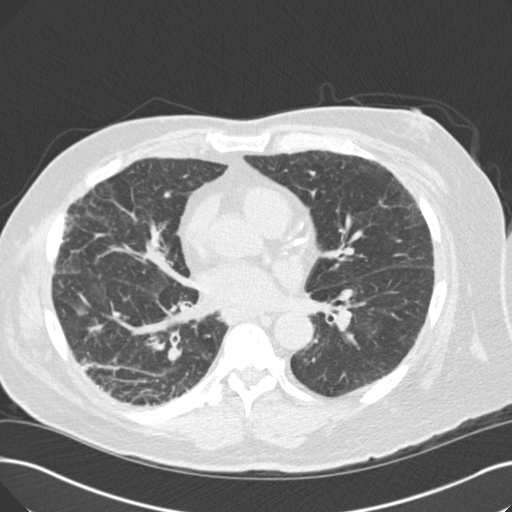
[im 85/164  lung]
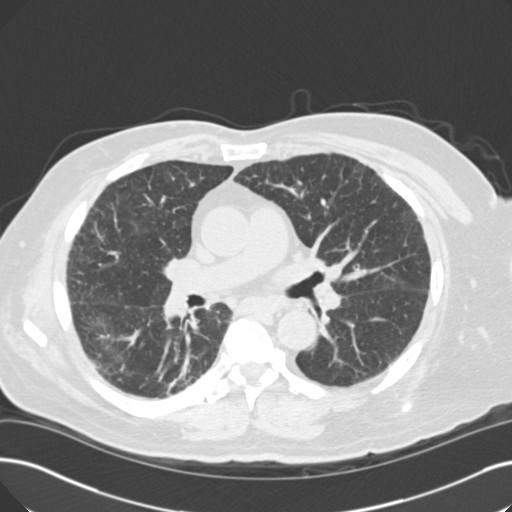
[im 91/164  lung]
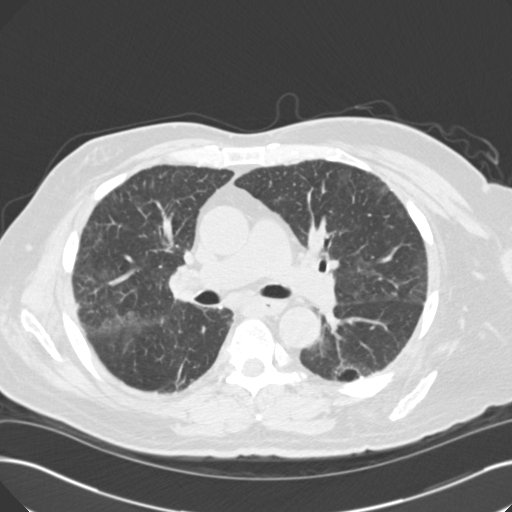
[im 98/164  mediastinal]
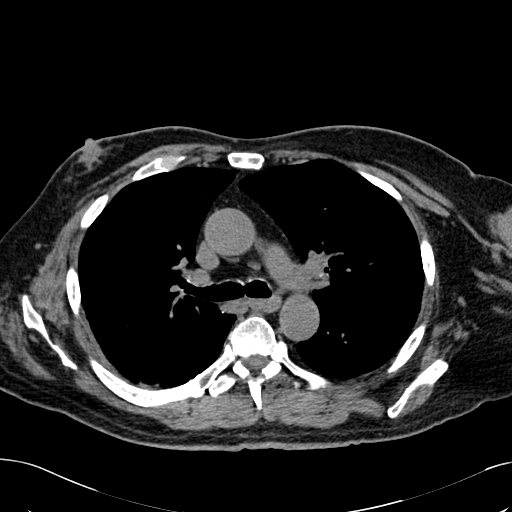
[im 98/164  lung]
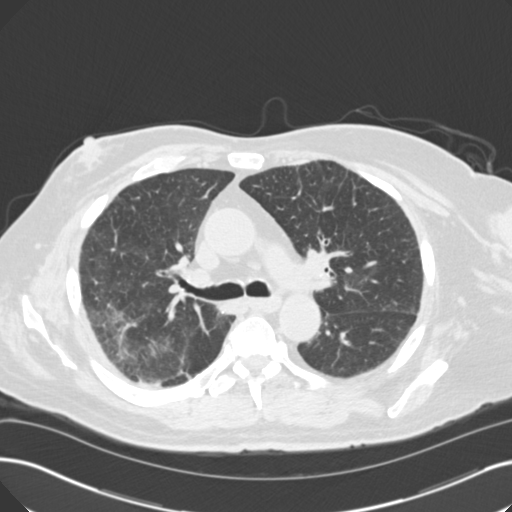
[im 115/164  lung]
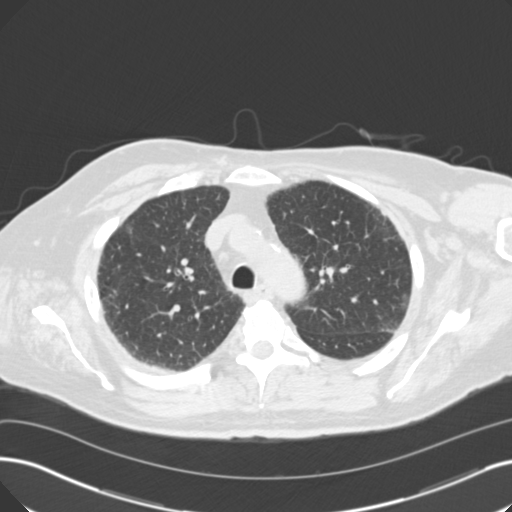
[im 127/164  lung]
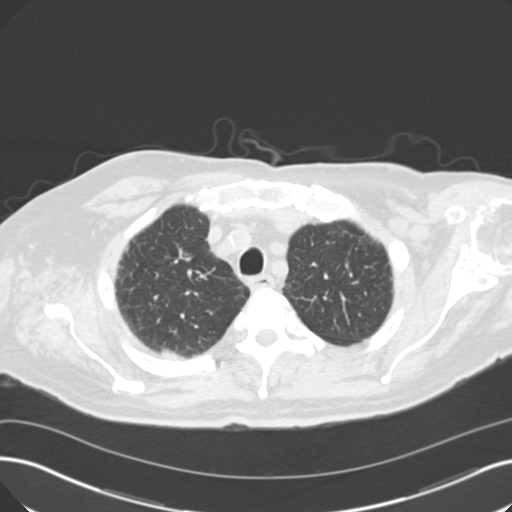
[im 139/164  lung]
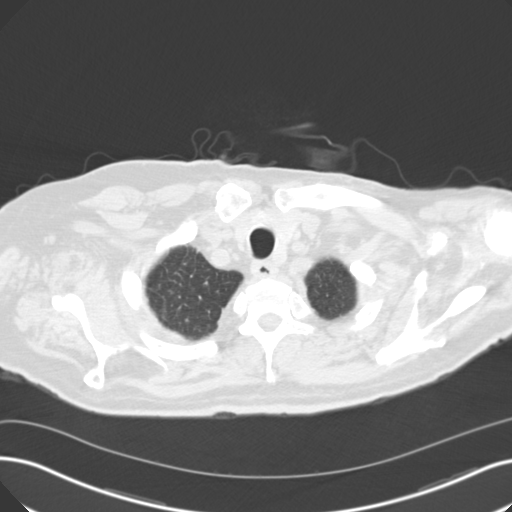
[im 151/164  mediastinal]
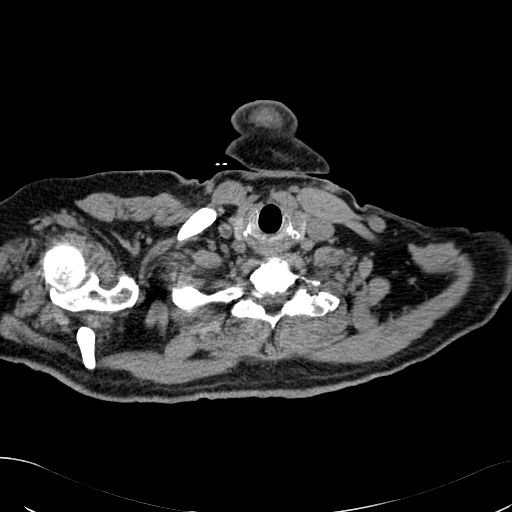
[im 151/164  lung]
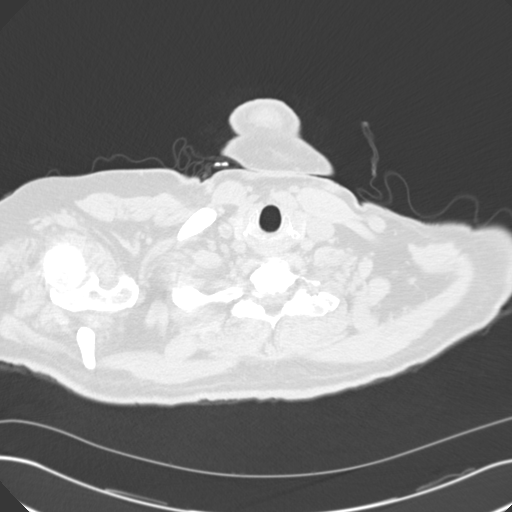

[13 of 34 positions shown; findings below may reference images not displayed]

FINDINGS: Mediastinum/Lymph Nodes: Heart size is normal. There is no
significant pericardial fluid, thickening or pericardial
calcification. There is atherosclerosis of the thoracic aorta, the
great vessels of the mediastinum and the coronary arteries,
including calcified atherosclerotic plaque in the left main, left
anterior descending, left circumflex and right coronary arteries. No
pathologically enlarged mediastinal or hilar lymph nodes. Please
note that accurate exclusion of hilar adenopathy is limited on
noncontrast CT scans. Circumferential thickening of the distal third
of the esophagus. No axillary lymphadenopathy.

Lungs/Pleura: In the periphery of the right lower lobe (image 87 of
series 3) there is a mass-like area of ground-glass attenuation
measuring 2.7 x 3.4 cm which has become progressively more prominent
when compared to prior studies. Prior examinations were and part
limited by motion, and adjacent atelectasis, which made this area
less apparent, however, this does appear persistent since
09/03/2015, and enlarging, and there appear to be potential internal
areas that are solid measuring up to 1.3 cm in length (image 86 of
series 2). Several other patchy areas of ground-glass attenuation
are also noted throughout the lungs bilaterally, but none of these
appear very nodular at this time. Specifically, the previously
described 10 mm ground-glass attenuation nodule in the right upper
lobe has resolved compared to prior study 01/07/2016. Areas of
subsegmental atelectasis and/or scarring are noted throughout the
periphery of the right mid to lower lung. No acute consolidative
airspace disease. No pleural effusions. Suture line in the posterior
aspect of the right lower lobe suggestive of prior wedge resection.

Upper Abdomen: Status post cholecystectomy.

Musculoskeletal/Soft Tissues: There are no aggressive appearing
lytic or blastic lesions noted in the visualized portions of the
skeleton.
IMPRESSION: 1. While the previously noted small right upper lobe ground-glass
attenuation nodule has resolved, there is an area of ground-glass
attenuation in the periphery of the right lower lobe which has
persisted over several prior examinations dating back to 09/03/2015
and has progressively be come more mass-like in appearance
(currently 2.7 x 3.4 cm), now with potential areas of internal soft
tissue measuring up to 13 mm. These findings are concerning for
potential slow-growing neoplasm such as an adenocarcinoma, and
biopsy or surgical resection is recommended at this time. This
recommendation follows the consensus statement: Guidelines for
Management of Incidental Pulmonary Nodules Detected on CT
Images:From the [HOSPITAL] 9005; published online before
print (10.1148/[id number]).
2. Atherosclerosis, including U left main and 2 vessel coronary
artery disease. Please note that although the presence of coronary
artery calcium documents the presence of coronary artery disease,
the severity of this disease and any potential stenosis cannot be
assessed on this non-gated CT examination. Assessment for potential
risk factor modification, dietary therapy or pharmacologic therapy
may be warranted, if clinically indicated.
These results were called by telephone at the time of interpretation
on 04/10/2016 at [DATE] to Dr. Avhashoni for Dr. DANMARIOIEL MENGYAO, who
verbally acknowledged these results.

## 2018-10-22 DEATH — deceased
